# Patient Record
Sex: Female | Born: 1965
Health system: Southern US, Community
[De-identification: ages and names within clinical notes are randomized; demographics above are authoritative.]

## PROBLEM LIST (undated history)

## (undated) DIAGNOSIS — M1990 Unspecified osteoarthritis, unspecified site: Secondary | ICD-10-CM

## (undated) DIAGNOSIS — I10 Essential (primary) hypertension: Secondary | ICD-10-CM

## (undated) DIAGNOSIS — F329 Major depressive disorder, single episode, unspecified: Secondary | ICD-10-CM

## (undated) DIAGNOSIS — E119 Type 2 diabetes mellitus without complications: Secondary | ICD-10-CM

## (undated) DIAGNOSIS — F32A Depression, unspecified: Secondary | ICD-10-CM

## (undated) DIAGNOSIS — F419 Anxiety disorder, unspecified: Secondary | ICD-10-CM

## (undated) HISTORY — PX: CHOLECYSTECTOMY: SHX55

## (undated) HISTORY — PX: BACK SURGERY: SHX140

## (undated) HISTORY — PX: ABDOMINAL HYSTERECTOMY: SHX81

---

## 1998-01-05 ENCOUNTER — Other Ambulatory Visit: Admission: RE | Admit: 1998-01-05 | Discharge: 1998-01-05 | Payer: Self-pay | Admitting: Internal Medicine

## 1998-04-09 ENCOUNTER — Encounter: Admission: RE | Admit: 1998-04-09 | Discharge: 1998-05-01 | Payer: Self-pay | Admitting: Orthopaedic Surgery

## 1999-02-15 ENCOUNTER — Encounter: Payer: Self-pay | Admitting: Internal Medicine

## 1999-02-15 ENCOUNTER — Encounter: Admission: RE | Admit: 1999-02-15 | Discharge: 1999-02-15 | Payer: Self-pay | Admitting: Internal Medicine

## 1999-03-24 ENCOUNTER — Ambulatory Visit (HOSPITAL_COMMUNITY): Admission: RE | Admit: 1999-03-24 | Discharge: 1999-03-24 | Payer: Self-pay | Admitting: Neurosurgery

## 1999-03-24 ENCOUNTER — Encounter: Payer: Self-pay | Admitting: Neurosurgery

## 1999-04-07 ENCOUNTER — Ambulatory Visit (HOSPITAL_COMMUNITY): Admission: RE | Admit: 1999-04-07 | Discharge: 1999-04-07 | Payer: Self-pay | Admitting: Neurosurgery

## 1999-04-07 ENCOUNTER — Encounter: Payer: Self-pay | Admitting: Neurosurgery

## 1999-04-27 ENCOUNTER — Encounter: Payer: Self-pay | Admitting: Neurosurgery

## 1999-04-27 ENCOUNTER — Ambulatory Visit (HOSPITAL_COMMUNITY): Admission: RE | Admit: 1999-04-27 | Discharge: 1999-04-27 | Payer: Self-pay | Admitting: Neurosurgery

## 1999-05-10 ENCOUNTER — Other Ambulatory Visit: Admission: RE | Admit: 1999-05-10 | Discharge: 1999-05-10 | Payer: Self-pay | Admitting: Family Medicine

## 1999-05-26 ENCOUNTER — Encounter: Payer: Self-pay | Admitting: Neurosurgery

## 1999-05-28 ENCOUNTER — Observation Stay (HOSPITAL_COMMUNITY): Admission: RE | Admit: 1999-05-28 | Discharge: 1999-05-29 | Payer: Self-pay | Admitting: Neurosurgery

## 1999-05-28 ENCOUNTER — Encounter: Payer: Self-pay | Admitting: Neurosurgery

## 1999-08-04 ENCOUNTER — Encounter: Payer: Self-pay | Admitting: Emergency Medicine

## 1999-08-04 ENCOUNTER — Emergency Department (HOSPITAL_COMMUNITY): Admission: EM | Admit: 1999-08-04 | Discharge: 1999-08-04 | Payer: Self-pay | Admitting: Emergency Medicine

## 1999-08-15 ENCOUNTER — Emergency Department (HOSPITAL_COMMUNITY): Admission: EM | Admit: 1999-08-15 | Discharge: 1999-08-15 | Payer: Self-pay | Admitting: Emergency Medicine

## 1999-11-06 ENCOUNTER — Ambulatory Visit (HOSPITAL_COMMUNITY): Admission: RE | Admit: 1999-11-06 | Discharge: 1999-11-06 | Payer: Self-pay | Admitting: Neurosurgery

## 1999-11-06 ENCOUNTER — Encounter: Payer: Self-pay | Admitting: Neurosurgery

## 1999-11-24 ENCOUNTER — Ambulatory Visit (HOSPITAL_COMMUNITY): Admission: RE | Admit: 1999-11-24 | Discharge: 1999-11-24 | Payer: Self-pay | Admitting: Neurosurgery

## 1999-11-24 ENCOUNTER — Encounter: Payer: Self-pay | Admitting: Neurosurgery

## 1999-12-08 ENCOUNTER — Ambulatory Visit (HOSPITAL_COMMUNITY): Admission: RE | Admit: 1999-12-08 | Discharge: 1999-12-08 | Payer: Self-pay | Admitting: Neurosurgery

## 1999-12-08 ENCOUNTER — Encounter: Payer: Self-pay | Admitting: Neurosurgery

## 1999-12-31 ENCOUNTER — Encounter: Admission: RE | Admit: 1999-12-31 | Discharge: 1999-12-31 | Payer: Self-pay | Admitting: Neurosurgery

## 1999-12-31 ENCOUNTER — Encounter: Payer: Self-pay | Admitting: Neurosurgery

## 2000-10-19 ENCOUNTER — Other Ambulatory Visit: Admission: RE | Admit: 2000-10-19 | Discharge: 2000-10-19 | Payer: Self-pay | Admitting: Family Medicine

## 2001-01-31 ENCOUNTER — Encounter: Payer: Self-pay | Admitting: Neurosurgery

## 2001-01-31 ENCOUNTER — Ambulatory Visit (HOSPITAL_COMMUNITY): Admission: RE | Admit: 2001-01-31 | Discharge: 2001-01-31 | Payer: Self-pay | Admitting: Neurosurgery

## 2001-05-02 ENCOUNTER — Ambulatory Visit (HOSPITAL_COMMUNITY): Admission: RE | Admit: 2001-05-02 | Discharge: 2001-05-02 | Payer: Self-pay | Admitting: Family Medicine

## 2003-04-23 ENCOUNTER — Other Ambulatory Visit: Payer: Self-pay

## 2004-02-20 ENCOUNTER — Emergency Department: Payer: Self-pay | Admitting: Unknown Physician Specialty

## 2004-06-15 ENCOUNTER — Emergency Department: Payer: Self-pay | Admitting: General Practice

## 2004-08-23 ENCOUNTER — Emergency Department: Payer: Self-pay | Admitting: General Practice

## 2004-10-18 ENCOUNTER — Emergency Department: Payer: Self-pay | Admitting: Emergency Medicine

## 2004-10-27 ENCOUNTER — Emergency Department: Payer: Self-pay | Admitting: Emergency Medicine

## 2005-01-04 ENCOUNTER — Emergency Department: Payer: Self-pay | Admitting: Emergency Medicine

## 2005-01-22 ENCOUNTER — Emergency Department: Payer: Self-pay | Admitting: Emergency Medicine

## 2006-05-02 ENCOUNTER — Emergency Department: Payer: Self-pay | Admitting: Emergency Medicine

## 2006-10-10 ENCOUNTER — Emergency Department: Payer: Self-pay | Admitting: Emergency Medicine

## 2011-01-27 ENCOUNTER — Emergency Department: Payer: Self-pay | Admitting: Emergency Medicine

## 2011-04-01 ENCOUNTER — Emergency Department: Payer: Self-pay | Admitting: Emergency Medicine

## 2011-10-10 ENCOUNTER — Emergency Department: Payer: Self-pay | Admitting: Emergency Medicine

## 2012-10-31 ENCOUNTER — Emergency Department: Payer: Self-pay | Admitting: Emergency Medicine

## 2012-12-29 ENCOUNTER — Emergency Department: Payer: Self-pay | Admitting: Emergency Medicine

## 2013-02-22 ENCOUNTER — Emergency Department: Payer: Self-pay | Admitting: Internal Medicine

## 2013-07-06 ENCOUNTER — Emergency Department: Payer: Self-pay | Admitting: Emergency Medicine

## 2013-07-06 ENCOUNTER — Emergency Department: Payer: Self-pay | Admitting: Internal Medicine

## 2013-07-07 LAB — CBC
HCT: 45.9 % (ref 35.0–47.0)
HGB: 15.2 g/dL (ref 12.0–16.0)
MCH: 29.9 pg (ref 26.0–34.0)
MCHC: 33.1 g/dL (ref 32.0–36.0)
MCV: 90 fL (ref 80–100)
Platelet: 257 10*3/uL (ref 150–440)
RBC: 5.08 10*6/uL (ref 3.80–5.20)
RDW: 13.7 % (ref 11.5–14.5)
WBC: 6.5 10*3/uL (ref 3.6–11.0)

## 2013-07-07 LAB — BASIC METABOLIC PANEL
Anion Gap: 11 (ref 7–16)
BUN: 10 mg/dL (ref 7–18)
Calcium, Total: 9.5 mg/dL (ref 8.5–10.1)
Chloride: 101 mmol/L (ref 98–107)
Co2: 20 mmol/L — ABNORMAL LOW (ref 21–32)
Creatinine: 1.2 mg/dL (ref 0.60–1.30)
EGFR (African American): >60
GFR CALC NON AF AMER: 54 — AB
Glucose: 384 mg/dL — ABNORMAL HIGH (ref 65–99)
Osmolality: 279 (ref 275–301)
Potassium: 4 mmol/L (ref 3.5–5.1)
Sodium: 132 mmol/L — ABNORMAL LOW (ref 136–145)

## 2013-07-07 LAB — URINALYSIS, COMPLETE
Bacteria: NONE SEEN
Bilirubin,UR: NEGATIVE
Blood: NEGATIVE
LEUKOCYTE ESTERASE: NEGATIVE
Nitrite: NEGATIVE
PROTEIN: NEGATIVE
Ph: 6 (ref 4.5–8.0)
RBC,UR: <1 /HPF (ref 0–5)
Specific Gravity: 1.023 (ref 1.003–1.030)
Squamous Epithelial: <1
WBC UR: <1 /HPF (ref 0–5)

## 2013-07-07 LAB — TROPONIN I: Troponin-I: <0.02 ng/mL

## 2013-10-11 ENCOUNTER — Emergency Department: Payer: Self-pay | Admitting: Emergency Medicine

## 2013-10-23 ENCOUNTER — Emergency Department: Payer: Self-pay | Admitting: Emergency Medicine

## 2013-12-19 ENCOUNTER — Ambulatory Visit: Payer: Self-pay | Attending: Internal Medicine | Admitting: Internal Medicine

## 2013-12-19 ENCOUNTER — Ambulatory Visit: Payer: Self-pay | Attending: Internal Medicine

## 2013-12-19 ENCOUNTER — Encounter: Payer: Self-pay | Admitting: Internal Medicine

## 2013-12-19 VITALS — BP 161/88 | HR 71 | Temp 98.2°F | Resp 16 | Wt 183.6 lb

## 2013-12-19 DIAGNOSIS — F3289 Other specified depressive episodes: Secondary | ICD-10-CM | POA: Insufficient documentation

## 2013-12-19 DIAGNOSIS — Z8249 Family history of ischemic heart disease and other diseases of the circulatory system: Secondary | ICD-10-CM | POA: Insufficient documentation

## 2013-12-19 DIAGNOSIS — Z1389 Encounter for screening for other disorder: Secondary | ICD-10-CM | POA: Insufficient documentation

## 2013-12-19 DIAGNOSIS — L293 Anogenital pruritus, unspecified: Secondary | ICD-10-CM

## 2013-12-19 DIAGNOSIS — N898 Other specified noninflammatory disorders of vagina: Secondary | ICD-10-CM

## 2013-12-19 DIAGNOSIS — E089 Diabetes mellitus due to underlying condition without complications: Secondary | ICD-10-CM

## 2013-12-19 DIAGNOSIS — F17200 Nicotine dependence, unspecified, uncomplicated: Secondary | ICD-10-CM | POA: Insufficient documentation

## 2013-12-19 DIAGNOSIS — E119 Type 2 diabetes mellitus without complications: Secondary | ICD-10-CM | POA: Insufficient documentation

## 2013-12-19 DIAGNOSIS — F329 Major depressive disorder, single episode, unspecified: Secondary | ICD-10-CM | POA: Insufficient documentation

## 2013-12-19 DIAGNOSIS — F32A Depression, unspecified: Secondary | ICD-10-CM

## 2013-12-19 DIAGNOSIS — F17210 Nicotine dependence, cigarettes, uncomplicated: Secondary | ICD-10-CM | POA: Insufficient documentation

## 2013-12-19 DIAGNOSIS — N899 Noninflammatory disorder of vagina, unspecified: Secondary | ICD-10-CM | POA: Insufficient documentation

## 2013-12-19 DIAGNOSIS — H538 Other visual disturbances: Secondary | ICD-10-CM | POA: Insufficient documentation

## 2013-12-19 DIAGNOSIS — Z888 Allergy status to other drugs, medicaments and biological substances status: Secondary | ICD-10-CM | POA: Insufficient documentation

## 2013-12-19 DIAGNOSIS — Z833 Family history of diabetes mellitus: Secondary | ICD-10-CM | POA: Insufficient documentation

## 2013-12-19 DIAGNOSIS — I10 Essential (primary) hypertension: Secondary | ICD-10-CM | POA: Insufficient documentation

## 2013-12-19 DIAGNOSIS — E139 Other specified diabetes mellitus without complications: Secondary | ICD-10-CM

## 2013-12-19 DIAGNOSIS — Z139 Encounter for screening, unspecified: Secondary | ICD-10-CM

## 2013-12-19 LAB — CBC WITH DIFFERENTIAL/PLATELET
BASOS ABS: 0 10*3/uL (ref 0.0–0.1)
Basophils Relative: 0 % (ref 0–1)
EOS PCT: 2 % (ref 0–5)
Eosinophils Absolute: 0.2 10*3/uL (ref 0.0–0.7)
HCT: 45 % (ref 36.0–46.0)
HEMOGLOBIN: 15.6 g/dL — AB (ref 12.0–15.0)
LYMPHS ABS: 4.7 10*3/uL — AB (ref 0.7–4.0)
Lymphocytes Relative: 41 % (ref 12–46)
MCH: 30.4 pg (ref 26.0–34.0)
MCHC: 34.7 g/dL (ref 30.0–36.0)
MCV: 87.5 fL (ref 78.0–100.0)
MONO ABS: 0.6 10*3/uL (ref 0.1–1.0)
MONOS PCT: 5 % (ref 3–12)
NEUTROS ABS: 5.9 10*3/uL (ref 1.7–7.7)
Neutrophils Relative %: 52 % (ref 43–77)
Platelets: 318 10*3/uL (ref 150–400)
RBC: 5.14 MIL/uL — AB (ref 3.87–5.11)
RDW: 13.6 % (ref 11.5–15.5)
WBC: 11.4 10*3/uL — AB (ref 4.0–10.5)

## 2013-12-19 LAB — GLUCOSE, POCT (MANUAL RESULT ENTRY): POC Glucose: 310 mg/dl — AB (ref 70–99)

## 2013-12-19 LAB — POCT GLYCOSYLATED HEMOGLOBIN (HGB A1C)

## 2013-12-19 MED ORDER — RAMIPRIL 2.5 MG PO CAPS: 2.5000 mg | ORAL_CAPSULE | Freq: Every day | ORAL | Status: DC

## 2013-12-19 MED ORDER — FLUCONAZOLE 150 MG PO TABS: 150.0000 mg | ORAL_TABLET | Freq: Once | ORAL | Status: DC

## 2013-12-19 MED ORDER — INSULIN ASPART 100 UNIT/ML ~~LOC~~ SOLN
10.0000 [IU] | Freq: Once | SUBCUTANEOUS | Status: AC
  Administered 2013-12-19: 10 [IU] via SUBCUTANEOUS

## 2013-12-19 MED ORDER — INSULIN ASPART PROT & ASPART (70-30 MIX) 100 UNIT/ML ~~LOC~~ SUSP: 15.0000 [IU] | Freq: Two times a day (BID) | SUBCUTANEOUS | Status: DC

## 2013-12-19 MED ORDER — BUPROPION HCL ER (XL) 150 MG PO TB24: 150.0000 mg | ORAL_TABLET | Freq: Every day | ORAL | Status: DC

## 2013-12-19 NOTE — Patient Instructions (Addendum)
Smoking Cessation Quitting smoking is important to your health and has many advantages. However, it is not always easy to quit since nicotine is a very addictive drug. Oftentimes, people try 3 times or more before being able to quit. This document explains the best ways for you to prepare to quit smoking. Quitting takes hard work and a lot of effort, but you can do it. ADVANTAGES OF QUITTING SMOKING  You will live longer, feel better, and live better.  Your body will feel the impact of quitting smoking almost immediately.  Within 20 minutes, blood pressure decreases. Your pulse returns to its normal level.  After 8 hours, carbon monoxide levels in the blood return to normal. Your oxygen level increases.  After 24 hours, the chance of having a heart attack starts to decrease. Your breath, hair, and body stop smelling like smoke.  After 48 hours, damaged nerve endings begin to recover. Your sense of taste and smell improve.  After 72 hours, the body is virtually free of nicotine. Your bronchial tubes relax and breathing becomes easier.  After 2 to 12 weeks, lungs can hold more air. Exercise becomes easier and circulation improves.  The risk of having a heart attack, stroke, cancer, or lung disease is greatly reduced.  After 1 year, the risk of coronary heart disease is cut in half.  After 5 years, the risk of stroke falls to the same as a nonsmoker.  After 10 years, the risk of lung cancer is cut in half and the risk of other cancers decreases significantly.  After 15 years, the risk of coronary heart disease drops, usually to the level of a nonsmoker.  If you are pregnant, quitting smoking will improve your chances of having a healthy baby.  The people you live with, especially any children, will be healthier.  You will have extra money to spend on things other than cigarettes. QUESTIONS TO THINK ABOUT BEFORE ATTEMPTING TO QUIT You may want to talk about your answers with your  health care provider.  Why do you want to quit?  If you tried to quit in the past, what helped and what did not?  What will be the most difficult situations for you after you quit? How will you plan to handle them?  Who can help you through the tough times? Your family? Friends? A health care provider?  What pleasures do you get from smoking? What ways can you still get pleasure if you quit? Here are some questions to ask your health care provider:  How can you help me to be successful at quitting?  What medicine do you think would be best for me and how should I take it?  What should I do if I need more help?  What is smoking withdrawal like? How can I get information on withdrawal? GET READY  Set a quit date.  Change your environment by getting rid of all cigarettes, ashtrays, matches, and lighters in your home, car, or work. Do not let people smoke in your home.  Review your past attempts to quit. Think about what worked and what did not. GET SUPPORT AND ENCOURAGEMENT You have a better chance of being successful if you have help. You can get support in many ways.  Tell your family, friends, and coworkers that you are going to quit and need their support. Ask them not to smoke around you.  Get individual, group, or telephone counseling and support. Programs are available at local hospitals and health centers. Call   your local health department for information about programs in your area.  Spiritual beliefs and practices may help some smokers quit.  Download a "quit meter" on your computer to keep track of quit statistics, such as how long you have gone without smoking, cigarettes not smoked, and money saved.  Get a self-help book about quitting smoking and staying off tobacco. LEARN NEW SKILLS AND BEHAVIORS  Distract yourself from urges to smoke. Talk to someone, go for a walk, or occupy your time with a task.  Change your normal routine. Take a different route to work.  Drink tea instead of coffee. Eat breakfast in a different place.  Reduce your stress. Take a hot bath, exercise, or read a book.  Plan something enjoyable to do every day. Reward yourself for not smoking.  Explore interactive web-based programs that specialize in helping you quit. GET MEDICINE AND USE IT CORRECTLY Medicines can help you stop smoking and decrease the urge to smoke. Combining medicine with the above behavioral methods and support can greatly increase your chances of successfully quitting smoking.  Nicotine replacement therapy helps deliver nicotine to your body without the negative effects and risks of smoking. Nicotine replacement therapy includes nicotine gum, lozenges, inhalers, nasal sprays, and skin patches. Some may be available over-the-counter and others require a prescription.  Antidepressant medicine helps people abstain from smoking, but how this works is unknown. This medicine is available by prescription.  Nicotinic receptor partial agonist medicine simulates the effect of nicotine in your brain. This medicine is available by prescription. Ask your health care provider for advice about which medicines to use and how to use them based on your health history. Your health care provider will tell you what side effects to look out for if you choose to be on a medicine or therapy. Carefully read the information on the package. Do not use any other product containing nicotine while using a nicotine replacement product.  RELAPSE OR DIFFICULT SITUATIONS Most relapses occur within the first 3 months after quitting. Do not be discouraged if you start smoking again. Remember, most people try several times before finally quitting. You may have symptoms of withdrawal because your body is used to nicotine. You may crave cigarettes, be irritable, feel very hungry, cough often, get headaches, or have difficulty concentrating. The withdrawal symptoms are only temporary. They are strongest  when you first quit, but they will go away within 10-14 days. To reduce the chances of relapse, try to:  Avoid drinking alcohol. Drinking lowers your chances of successfully quitting.  Reduce the amount of caffeine you consume. Once you quit smoking, the amount of caffeine in your body increases and can give you symptoms, such as a rapid heartbeat, sweating, and anxiety.  Avoid smokers because they can make you want to smoke.  Do not let weight gain distract you. Many smokers will gain weight when they quit, usually less than 10 pounds. Eat a healthy diet and stay active. You can always lose the weight gained after you quit.  Find ways to improve your mood other than smoking. FOR MORE INFORMATION  www.smokefree.gov  Document Released: 04/05/2001 Document Revised: 08/26/2013 Document Reviewed: 07/21/2011 Exit[employer] Patient Information 2015 [employer]. This information is not intended to replace advice given to you by your health care provider. Make sure you discuss any questions you have with your health care provider. DASH Eating Plan DASH stands for "Dietary Approaches to Stop Hypertension." The DASH eating plan is a healthy eating plan that has   been shown to reduce high blood pressure (hypertension). Additional health benefits may include reducing the risk of type 2 diabetes mellitus, heart disease, and stroke. The DASH eating plan may also help with weight loss. WHAT DO I NEED TO KNOW ABOUT THE DASH EATING PLAN? For the DASH eating plan, you will follow these general guidelines:  Choose foods with a percent daily value for sodium of less than 5% (as listed on the food label).  Use salt-free seasonings or herbs instead of table salt or sea salt.  Check with your health care provider or pharmacist before using salt substitutes.  Eat lower-sodium products, often labeled as "lower sodium" or "no salt added."  Eat fresh foods.  Eat more vegetables, fruits, and low-fat dairy  products.  Choose whole grains. Look for the word "whole" as the first word in the ingredient list.  Choose fish and skinless chicken or turkey more often than red meat. Limit fish, poultry, and meat to 6 oz (170 g) each day.  Limit sweets, desserts, sugars, and sugary drinks.  Choose heart-healthy fats.  Limit cheese to 1 oz (28 g) per day.  Eat more home-cooked food and less restaurant, buffet, and fast food.  Limit fried foods.  Cook foods using methods other than frying.  Limit canned vegetables. If you do use them, rinse them well to decrease the sodium.  When eating at a restaurant, ask that your food be prepared with less salt, or no salt if possible. WHAT FOODS CAN I EAT? Seek help from a dietitian for individual calorie needs. Grains Whole grain or whole wheat bread. Brown rice. Whole grain or whole wheat pasta. Quinoa, bulgur, and whole grain cereals. Low-sodium cereals. Corn or whole wheat flour tortillas. Whole grain cornbread. Whole grain crackers. Low-sodium crackers. Vegetables Fresh or frozen vegetables (raw, steamed, roasted, or grilled). Low-sodium or reduced-sodium tomato and vegetable juices. Low-sodium or reduced-sodium tomato sauce and paste. Low-sodium or reduced-sodium canned vegetables.  Fruits All fresh, canned (in natural juice), or frozen fruits. Meat and Other Protein Products Ground beef (85% or leaner), grass-fed beef, or beef trimmed of fat. Skinless chicken or turkey. Ground chicken or turkey. Pork trimmed of fat. All fish and seafood. Eggs. Dried beans, peas, or lentils. Unsalted nuts and seeds. Unsalted canned beans. Dairy Low-fat dairy products, such as skim or 1% milk, 2% or reduced-fat cheeses, low-fat ricotta or cottage cheese, or plain low-fat yogurt. Low-sodium or reduced-sodium cheeses. Fats and Oils Tub margarines without trans fats. Light or reduced-fat mayonnaise and salad dressings (reduced sodium). Avocado. Safflower, olive, or canola  oils. Natural peanut or almond butter. Other Unsalted popcorn and pretzels. The items listed above may not be a complete list of recommended foods or beverages. Contact your dietitian for more options. WHAT FOODS ARE NOT RECOMMENDED? Grains White bread. White pasta. White rice. Refined cornbread. Bagels and croissants. Crackers that contain trans fat. Vegetables Creamed or fried vegetables. Vegetables in a cheese sauce. Regular canned vegetables. Regular canned tomato sauce and paste. Regular tomato and vegetable juices. Fruits Dried fruits. Canned fruit in light or heavy syrup. Fruit juice. Meat and Other Protein Products Fatty cuts of meat. Ribs, chicken wings, bacon, sausage, bologna, salami, chitterlings, fatback, hot dogs, bratwurst, and packaged luncheon meats. Salted nuts and seeds. Canned beans with salt. Dairy Whole or 2% milk, cream, half-and-half, and cream cheese. Whole-fat or sweetened yogurt. Full-fat cheeses or blue cheese. Nondairy creamers and whipped toppings. Processed cheese, cheese spreads, or cheese curds. Condiments Onion and garlic salt,   seasoned salt, table salt, and sea salt. Canned and packaged gravies. Worcestershire sauce. Tartar sauce. Barbecue sauce. Teriyaki sauce. Soy sauce, including reduced sodium. Steak sauce. Fish sauce. Oyster sauce. Cocktail sauce. Horseradish. Ketchup and mustard. Meat flavorings and tenderizers. Bouillon cubes. Hot sauce. Tabasco sauce. Marinades. Taco seasonings. Relishes. Fats and Oils Butter, stick margarine, lard, shortening, ghee, and bacon fat. Coconut, palm kernel, or palm oils. Regular salad dressings. Other Pickles and olives. Salted popcorn and pretzels. The items listed above may not be a complete list of foods and beverages to avoid. Contact your dietitian for more information. WHERE CAN I FIND MORE INFORMATION? National Heart, Lung, and [employer] Institute: www.nhlbi.nih.gov/health/health-topics/topics/dash/ Document Released:  03/31/2011 Document Revised: 08/26/2013 Document Reviewed: 02/13/2013 ExitCare Patient Information 2015 [employer]. This information is not intended to replace advice given to you by your health care provider. Make sure you discuss any questions you have with your health care provider.  

## 2013-12-19 NOTE — Progress Notes (Signed)
Patient Demographics  Sara Vasquez, is a 48 y.o. female  [id number]  [id number]  DOB - [birth year]  CC:  Chief Complaint  Patient presents with  . Establish Care    dm       HPI: Sara Vasquez is a 47 y.o. female here today to establish medical care. She is history of diabetes for several years, patient is only taking Amaryl 2 mg twice a day, as per patient her blood pressure has been running high for the last one year but she never took any medication for that, her fasting sugar is usually more than 200 mg deciliter, her fasting sugar today is more than 300 mg/dL she denies any polyuria polydipsia but does complain of blurry vision denies any chest pain or shortness of breath, she does smoke cigarettes, I have advised patient to quit smoking she also reports history of depression and used to be on some medication in the past I have discussed with the patient her she can try Wellbutrin which will help her with both depression and to help with the quit smoking, she also complained of vaginal itching but denies any discharge. She is given 10 units of insulin today, her A1c is 11%.  Patient has No headache, No chest pain, No abdominal pain - No Nausea, No new weakness tingling or numbness, No Cough - SOB.  Allergies  Allergen Reactions  . Fentanyl   . Metformin And Related     Mood swings  . Phenergan [Promethazine Hcl]    No past medical history on file. No current outpatient prescriptions on file prior to visit.   No current facility-administered medications on file prior to visit.   Family History  Problem Relation Age of Onset  . Diabetes Mother   . Heart disease Mother   . Hypertension Father   . Heart disease Father   . Cancer Paternal Aunt     lung cancer  . Cancer Maternal Grandmother     breast cancer  . Diabetes Maternal Grandfather    History   Social History  . Marital Status: Married    Spouse Name: N/A    Number of Children: N/A  . Years of  Education: N/A   Occupational History  . Not on file.   Social History Main Topics  . Smoking status: Heavy Tobacco Smoker -- 1.00 packs/day for 35 years  . Smokeless tobacco: Not on file  . Alcohol Use: No  . Drug Use: Not on file  . Sexual Activity: Not on file   Other Topics Concern  . Not on file   Social History Narrative  . No narrative on file    Review of Systems: Constitutional: Negative for fever, chills, diaphoresis, activity change, appetite change and fatigue. HENT: Negative for ear pain, nosebleeds, congestion, facial swelling, rhinorrhea, neck pain, neck stiffness and ear discharge.  Eyes: Negative for pain, discharge, redness, itching and visual disturbance. Respiratory: Negative for cough, choking, chest tightness, shortness of breath, wheezing and stridor.  Cardiovascular: Negative for chest pain, palpitations and leg swelling. Gastrointestinal: Negative for abdominal distention. Genitourinary: Negative for dysuria, urgency, frequency, hematuria, flank pain, decreased urine volume, difficulty urinating and dyspareunia.  Musculoskeletal: Negative for back pain, joint swelling, arthralgia and gait problem. Neurological: Negative for dizziness, tremors, seizures, syncope, facial asymmetry, speech difficulty, weakness, light-headedness, numbness and headaches.  Hematological: Negative for adenopathy. Does not bruise/bleed easily. Psychiatric/Behavioral: Negative for hallucinations, behavioral problems, confusion, dysphoric mood, decreased concentration and agitation.    Objective:  Filed Vitals:   12/19/13 0937  BP: 161/88  Pulse: 71  Temp: 98.2 F (36.8 C)  Resp: 16    Physical Exam: Constitutional: Patient appears well-developed and well-nourished. No distress. HENT: Normocephalic, atraumatic, External right and left ear normal. Oropharynx is clear and moist. Dental cavities. Eyes: Conjunctivae and EOM are normal. PERRLA, no scleral icterus. Neck:  Normal ROM. Neck supple. No JVD. No tracheal deviation. No thyromegaly. CVS: RRR, S1/S2 +, no murmurs, no gallops, no carotid bruit.  Pulmonary: Effort and breath sounds normal, no stridor, rhonchi, wheezes, rales.  Abdominal: Soft. BS +, no distension, tenderness, rebound or guarding.  Musculoskeletal: Normal range of motion. No edema and no tenderness.  Neuro: Alert. Normal reflexes, muscle tone coordination. No cranial nerve deficit. Skin: Skin is warm and dry. No rash noted. Not diaphoretic. No erythema. No pallor. Psychiatric: Normal mood and affect. Behavior, judgment, thought content normal.  No results found for this basename: WBC, HGB, HCT, MCV, PLT   No results found for this basename: CREATININE, BUN, NA, K, CL, CO2    Lab Results  Component Value Date   HGBA1C 11% 12/19/2013   Lipid Panel  No results found for this basename: chol, trig, hdl, cholhdl, vldl, ldlcalc       Assessment and plan:   1. Diabetes mellitus due to underlying condition without complications Results for orders placed in visit on 12/19/13  GLUCOSE, POCT (MANUAL RESULT ENTRY)      Result Value Ref Range   POC Glucose 310 (*) 70 - 99 mg/dl  POCT GLYCOSYLATED HEMOGLOBIN (HGB A1C)      Result Value Ref Range   Hemoglobin A1C 11%     Diabetes is uncontrolled, have advised patient for diabetes meal planning, also started patient on NovoLog 70/3015 units 2 times a day, she'll continue with Amaryl, patient will get diabetes teaching. - insulin aspart (novoLOG) injection 10 Units; Inject 0.1 mLs (10 Units total) into the skin once. - insulin aspart protamine- aspart (NOVOLOG MIX 70/30) (70-30) 100 UNIT/ML injection; Inject 0.15 mLs (15 Units total) into the skin 2 (two) times daily with a meal.  Dispense: 10 mL; Refill: 11 - Ambulatory referral to Ophthalmology  2. Essential hypertension, benign Advised patient for Dash diet and started on ramipril 2.5 mg daily   3. Smoking Advise patient to quit  smoking she'll try - buPROPion (WELLBUTRIN XL) 150 MG 24 hr tablet; Take 1 tablet (150 mg total) by mouth daily.  Dispense: 30 tablet; Refill: 3  4. Depression  - buPROPion (WELLBUTRIN XL) 150 MG 24 hr tablet; Take 1 tablet (150 mg total) by mouth daily.  Dispense: 30 tablet; Refill: 3  5. Blurry vision  - Ambulatory referral to Ophthalmology  6. Screening  ordered baseline blood work - CBC with Differential - COMPLETE METABOLIC PANEL WITH GFR - TSH - Lipid panel - Vit D  25 hydroxy (rtn osteoporosis monitoring) - MM DIGITAL SCREENING BILATERAL; Future  7. Vaginal itching  prescription Diflucan.      Health Maintenance  -Mammogram: ordered    Return in about 3 months (around 03/21/2014) for diabetes, hypertension, BP check and CBG check in 2 weeks/Nurse Visit.   Doris Cheadle, MD

## 2013-12-20 ENCOUNTER — Other Ambulatory Visit: Payer: Self-pay

## 2013-12-20 DIAGNOSIS — E089 Diabetes mellitus due to underlying condition without complications: Secondary | ICD-10-CM

## 2013-12-20 LAB — COMPLETE METABOLIC PANEL WITH GFR
ALBUMIN: 4.4 g/dL (ref 3.5–5.2)
ALT: 83 U/L — ABNORMAL HIGH (ref 0–35)
AST: 82 U/L — AB (ref 0–37)
Alkaline Phosphatase: 94 U/L (ref 39–117)
BUN: 8 mg/dL (ref 6–23)
CALCIUM: 9.9 mg/dL (ref 8.4–10.5)
CHLORIDE: 98 meq/L (ref 96–112)
CO2: 25 meq/L (ref 19–32)
CREATININE: 0.75 mg/dL (ref 0.50–1.10)
GFR, Est Non African American: >89 mL/min
GLUCOSE: 304 mg/dL — AB (ref 70–99)
POTASSIUM: 4.4 meq/L (ref 3.5–5.3)
Sodium: 135 mEq/L (ref 135–145)
Total Bilirubin: 0.5 mg/dL (ref 0.2–1.2)
Total Protein: 7.5 g/dL (ref 6.0–8.3)

## 2013-12-20 LAB — TSH: TSH: 1.363 u[IU]/mL (ref 0.350–4.500)

## 2013-12-20 LAB — LIPID PANEL
Cholesterol: 321 mg/dL — ABNORMAL HIGH (ref 0–200)
HDL: 42 mg/dL (ref >39)
Total CHOL/HDL Ratio: 7.6 Ratio
Triglycerides: 664 mg/dL — ABNORMAL HIGH (ref <150)

## 2013-12-20 LAB — VITAMIN D 25 HYDROXY (VIT D DEFICIENCY, FRACTURES): Vit D, 25-Hydroxy: 16 ng/mL — ABNORMAL LOW (ref 30–89)

## 2013-12-20 MED ORDER — INSULIN ASPART PROT & ASPART (70-30 MIX) 100 UNIT/ML ~~LOC~~ SUSP: 15.0000 [IU] | Freq: Two times a day (BID) | SUBCUTANEOUS | Status: DC

## 2014-01-02 ENCOUNTER — Telehealth: Payer: Self-pay | Admitting: Internal Medicine

## 2014-01-02 ENCOUNTER — Ambulatory Visit: Payer: Self-pay | Attending: Internal Medicine

## 2014-01-02 DIAGNOSIS — E1165 Type 2 diabetes mellitus with hyperglycemia: Principal | ICD-10-CM

## 2014-01-02 DIAGNOSIS — IMO0001 Reserved for inherently not codable concepts without codable children: Secondary | ICD-10-CM

## 2014-01-02 LAB — GLUCOSE, POCT (MANUAL RESULT ENTRY): POC GLUCOSE: 180 mg/dL — AB (ref 70–99)

## 2014-01-02 NOTE — Telephone Encounter (Signed)
Patient has come in today to request a call back from the nurse for a lab result; please f/u with patient

## 2014-01-15 ENCOUNTER — Telehealth: Payer: Self-pay | Admitting: Emergency Medicine

## 2014-01-15 ENCOUNTER — Telehealth: Payer: Self-pay | Admitting: Internal Medicine

## 2014-01-15 MED ORDER — GEMFIBROZIL 600 MG PO TABS: 600.0000 mg | ORAL_TABLET | Freq: Two times a day (BID) | ORAL | Status: DC

## 2014-01-15 MED ORDER — VITAMIN D (ERGOCALCIFEROL) 1.25 MG (50000 UNIT) PO CAPS: 50000.0000 [IU] | ORAL_CAPSULE | ORAL | Status: DC

## 2014-01-15 NOTE — Telephone Encounter (Signed)
Pt calling to request lab results. Please follow up with patient.

## 2014-01-15 NOTE — Telephone Encounter (Signed)
Pt given lab results with medication instructions to start taking prescribed Vitamin D 50,000 units and Lopid 600 mg tab BID. Pt encouraged to start low fat diet/exercise with repeat blood work in 3 mnths Pt also instructed to refrain from alcohol and tylenol due to elevated LFT's

## 2014-01-29 ENCOUNTER — Ambulatory Visit (HOSPITAL_COMMUNITY)
Admission: RE | Admit: 2014-01-29 | Discharge: 2014-01-29 | Disposition: A | Discharge status: Home / Self Care | Payer: Self-pay | Source: Ambulatory Visit | Attending: Internal Medicine | Admitting: Internal Medicine

## 2014-01-29 ENCOUNTER — Other Ambulatory Visit: Payer: Self-pay | Admitting: Internal Medicine

## 2014-01-29 DIAGNOSIS — Z139 Encounter for screening, unspecified: Secondary | ICD-10-CM

## 2014-01-29 DIAGNOSIS — Z1231 Encounter for screening mammogram for malignant neoplasm of breast: Secondary | ICD-10-CM

## 2014-02-05 ENCOUNTER — Telehealth: Payer: Self-pay | Admitting: Internal Medicine

## 2014-02-05 NOTE — Telephone Encounter (Signed)
Pt. Called to request results of mammogram. Please f/u with pt.

## 2014-02-07 NOTE — Telephone Encounter (Signed)
Left message with female to return call 

## 2014-02-14 ENCOUNTER — Telehealth: Payer: Self-pay | Admitting: Internal Medicine

## 2014-02-14 NOTE — Telephone Encounter (Signed)
Patient needs refill for: Glimepiride 2 mg ASAP because she has medication until Monday. Patient request her prescription send to our Pharmacy. Please follow up with Patient.

## 2014-02-17 ENCOUNTER — Telehealth: Payer: Self-pay

## 2014-02-17 ENCOUNTER — Other Ambulatory Visit: Payer: Self-pay

## 2014-02-17 MED ORDER — GLIMEPIRIDE 2 MG PO TABS: 2.0000 mg | ORAL_TABLET | Freq: Every day | ORAL | Status: DC

## 2014-02-17 NOTE — Telephone Encounter (Signed)
Patient called requesting a refill on her amaryl for her diabetes [facility]

## 2014-03-03 ENCOUNTER — Emergency Department: Payer: Self-pay | Admitting: Internal Medicine

## 2014-03-04 ENCOUNTER — Other Ambulatory Visit: Payer: Self-pay | Admitting: Internal Medicine

## 2014-03-04 ENCOUNTER — Telehealth: Payer: Self-pay | Admitting: Internal Medicine

## 2014-03-04 NOTE — Telephone Encounter (Signed)
Patient has come in today to request a medication refill for gemfibrozil (LOPID) 600 MG tablet; please f/u with patient about this request

## 2014-03-07 ENCOUNTER — Other Ambulatory Visit: Payer: Self-pay | Admitting: Emergency Medicine

## 2014-03-07 MED ORDER — GEMFIBROZIL 600 MG PO TABS: 600.0000 mg | ORAL_TABLET | Freq: Two times a day (BID) | ORAL | Status: DC

## 2014-03-17 ENCOUNTER — Other Ambulatory Visit: Payer: Self-pay | Admitting: Internal Medicine

## 2014-03-19 ENCOUNTER — Encounter: Payer: Self-pay | Admitting: Internal Medicine

## 2014-03-19 ENCOUNTER — Ambulatory Visit: Payer: Self-pay | Attending: Internal Medicine | Admitting: Internal Medicine

## 2014-03-19 VITALS — BP 131/85 | HR 69 | Temp 98.3°F | Resp 16

## 2014-03-19 DIAGNOSIS — Z720 Tobacco use: Secondary | ICD-10-CM

## 2014-03-19 DIAGNOSIS — E139 Other specified diabetes mellitus without complications: Secondary | ICD-10-CM

## 2014-03-19 DIAGNOSIS — F17210 Nicotine dependence, cigarettes, uncomplicated: Secondary | ICD-10-CM | POA: Insufficient documentation

## 2014-03-19 DIAGNOSIS — R059 Cough, unspecified: Secondary | ICD-10-CM

## 2014-03-19 DIAGNOSIS — E119 Type 2 diabetes mellitus without complications: Secondary | ICD-10-CM | POA: Insufficient documentation

## 2014-03-19 DIAGNOSIS — F17200 Nicotine dependence, unspecified, uncomplicated: Secondary | ICD-10-CM

## 2014-03-19 DIAGNOSIS — R7989 Other specified abnormal findings of blood chemistry: Secondary | ICD-10-CM | POA: Insufficient documentation

## 2014-03-19 DIAGNOSIS — E559 Vitamin D deficiency, unspecified: Secondary | ICD-10-CM | POA: Insufficient documentation

## 2014-03-19 DIAGNOSIS — R05 Cough: Secondary | ICD-10-CM | POA: Insufficient documentation

## 2014-03-19 DIAGNOSIS — E1169 Type 2 diabetes mellitus with other specified complication: Secondary | ICD-10-CM | POA: Insufficient documentation

## 2014-03-19 DIAGNOSIS — R0981 Nasal congestion: Secondary | ICD-10-CM | POA: Insufficient documentation

## 2014-03-19 DIAGNOSIS — I10 Essential (primary) hypertension: Secondary | ICD-10-CM | POA: Insufficient documentation

## 2014-03-19 DIAGNOSIS — E781 Pure hyperglyceridemia: Secondary | ICD-10-CM | POA: Insufficient documentation

## 2014-03-19 DIAGNOSIS — R945 Abnormal results of liver function studies: Secondary | ICD-10-CM

## 2014-03-19 DIAGNOSIS — E785 Hyperlipidemia, unspecified: Secondary | ICD-10-CM

## 2014-03-19 LAB — HEPATITIS C ANTIBODY: HCV Ab: NEGATIVE

## 2014-03-19 LAB — POCT GLYCOSYLATED HEMOGLOBIN (HGB A1C): Hemoglobin A1C: 7.6

## 2014-03-19 LAB — GLUCOSE, POCT (MANUAL RESULT ENTRY): POC Glucose: 176 mg/dl — AB (ref 70–99)

## 2014-03-19 LAB — HEPATITIS B SURFACE ANTIBODY,QUALITATIVE: Hep B S Ab: NEGATIVE

## 2014-03-19 LAB — HEPATITIS B SURFACE ANTIGEN: HEP B S AG: NEGATIVE

## 2014-03-19 MED ORDER — BENZONATATE 100 MG PO CAPS: 100.0000 mg | ORAL_CAPSULE | Freq: Three times a day (TID) | ORAL | Status: DC | PRN

## 2014-03-19 MED ORDER — FLUTICASONE PROPIONATE 50 MCG/ACT NA SUSP: 2.0000 | Freq: Every day | NASAL | Status: DC

## 2014-03-19 MED ORDER — GEMFIBROZIL 600 MG PO TABS: 600.0000 mg | ORAL_TABLET | Freq: Two times a day (BID) | ORAL | Status: DC

## 2014-03-19 MED ORDER — GLIMEPIRIDE 4 MG PO TABS: 4.0000 mg | ORAL_TABLET | Freq: Every day | ORAL | Status: DC

## 2014-03-19 MED ORDER — GLUCOSE BLOOD VI STRP: ORAL_STRIP | Status: DC

## 2014-03-19 NOTE — Progress Notes (Signed)
MRN: [id number] Name: Sara Vasquez  Sex: female Age: 48 y.o. DOB: 10/18/1965  Allergies: Fentanyl; Metformin and related; and Phenergan  Chief Complaint  Patient presents with  . Follow-up    HPI: Patient is 48 y.o. female who has history of diabetes, hypertension, hypertriglyceridemia, as per patient she went to the emergency room at [facility] 2 weeks ago with symptoms of bronchitis as per patient she was prescribed antibiotic which she already finished her and was also given inhaler, she was also advised to use over-the-counter Afrin for nasal congestion, she still have cough and postnasal drip denies any chest pain or shortness of breath denies any fever chills, she still smokes cigarettes, I have advised patient to quit smoking, she had a blood work done which was reviewed with the patient noticed elevated triglycerides, vitamin D deficiency as well as abnormal LFTs, as per patient she does not drink alcohol, denies any previous history of IV drug abuse, for diabetes she has been taking insulin 15 units 2 times a day as well as Amaryl 2 mg, she denies any hypoglycemic symptoms, her hemoglobin A1c has improved.  History reviewed. No pertinent past medical history.  Past Surgical History  Procedure Laterality Date  . Abdominal hysterectomy    . Back surgery    . Cholecystectomy        Medication List       This list is accurate as of: 03/19/14 11:15 AM.  Always use your most recent med list.               benzonatate 100 MG capsule  Commonly known as:  TESSALON  Take 1 capsule (100 mg total) by mouth 3 (three) times daily as needed for cough.     buPROPion 150 MG 24 hr tablet  Commonly known as:  WELLBUTRIN XL  Take 1 tablet (150 mg total) by mouth daily.     fluconazole 150 MG tablet  Commonly known as:  DIFLUCAN  Take 1 tablet (150 mg total) by mouth once.     fluticasone 50 MCG/ACT nasal spray  Commonly known as:  FLONASE  Place 2 sprays into  both nostrils daily.     gemfibrozil 600 MG tablet  Commonly known as:  LOPID  Take 1 tablet (600 mg total) by mouth 2 (two) times daily before a meal.     glimepiride 4 MG tablet  Commonly known as:  AMARYL  Take 1 tablet (4 mg total) by mouth daily with breakfast.     glucose blood test strip  Commonly known as:  CHOICE DM FORA G20 TEST STRIPS  Use as instructed     insulin aspart protamine- aspart (70-30) 100 UNIT/ML injection  Commonly known as:  NOVOLOG MIX 70/30  Inject 0.15 mLs (15 Units total) into the skin 2 (two) times daily with a meal.     ramipril 2.5 MG capsule  Commonly known as:  ALTACE  Take 1 capsule (2.5 mg total) by mouth daily.     Vitamin D (Ergocalciferol) 50000 UNITS Caps capsule  Commonly known as:  DRISDOL  Take 1 capsule (50,000 Units total) by mouth every 7 (seven) days.        Meds ordered this encounter  Medications  . gemfibrozil (LOPID) 600 MG tablet    Sig: Take 1 tablet (600 mg total) by mouth 2 (two) times daily before a meal.    Dispense:  90 tablet    Refill:  3  . glimepiride (  AMARYL) 4 MG tablet    Sig: Take 1 tablet (4 mg total) by mouth daily with breakfast.    Dispense:  30 tablet    Refill:  3  . fluticasone (FLONASE) 50 MCG/ACT nasal spray    Sig: Place 2 sprays into both nostrils daily.    Dispense:  16 g    Refill:  6  . benzonatate (TESSALON) 100 MG capsule    Sig: Take 1 capsule (100 mg total) by mouth 3 (three) times daily as needed for cough.    Dispense:  30 capsule    Refill:  1  . glucose blood (CHOICE DM FORA G20 TEST STRIPS) test strip    Sig: Use as instructed    Dispense:  100 each    Refill:  12    True check test strips     There is no immunization history on file for this patient.  Family History  Problem Relation Age of Onset  . Diabetes Mother   . Heart disease Mother   . Hypertension Father   . Heart disease Father   . Cancer Paternal Aunt     lung cancer  . Cancer Maternal Grandmother      breast cancer  . Diabetes Maternal Grandfather     History  Substance Use Topics  . Smoking status: Heavy Tobacco Smoker -- 1.00 packs/day for 35 years  . Smokeless tobacco: Not on file  . Alcohol Use: No    Review of Systems   As noted in HPI  Filed Vitals:   03/19/14 0940  BP: 131/85  Pulse: 69  Temp: 98.3 F (36.8 C)  Resp: 16    Physical Exam  Physical Exam  HENT:  Nasal congestion no sinus tenderness   Eyes: EOM are normal. Pupils are equal, round, and reactive to light.  Cardiovascular: Normal rate and regular rhythm.   Pulmonary/Chest: Breath sounds normal. No respiratory distress. She has no wheezes. She has no rales.  Abdominal: Soft. There is no tenderness. There is no rebound.    CBC    Component Value Date/Time   WBC 11.4* 12/19/2013 1005   RBC 5.14* 12/19/2013 1005   HGB 15.6* 12/19/2013 1005   HCT 45.0 12/19/2013 1005   PLT 318 12/19/2013 1005   MCV 87.5 12/19/2013 1005   LYMPHSABS 4.7* 12/19/2013 1005   MONOABS 0.6 12/19/2013 1005   EOSABS 0.2 12/19/2013 1005   BASOSABS 0.0 12/19/2013 1005    CMP     Component Value Date/Time   NA 135 12/19/2013 1005   K 4.4 12/19/2013 1005   CL 98 12/19/2013 1005   CO2 25 12/19/2013 1005   GLUCOSE 304* 12/19/2013 1005   BUN 8 12/19/2013 1005   CREATININE 0.75 12/19/2013 1005   CALCIUM 9.9 12/19/2013 1005   PROT 7.5 12/19/2013 1005   ALBUMIN 4.4 12/19/2013 1005   AST 82* 12/19/2013 1005   ALT 83* 12/19/2013 1005   ALKPHOS 94 12/19/2013 1005   BILITOT 0.5 12/19/2013 1005   GFRNONAA >89 12/19/2013 1005   GFRAA >89 12/19/2013 1005    Lab Results  Component Value Date/Time   CHOL 321* 12/19/2013 10:05 AM    No components found for: HGA1C  Lab Results  Component Value Date/Time   AST 82* 12/19/2013 10:05 AM    Assessment and Plan  Other specified diabetes mellitus without complications - Plan:  Results for orders placed or performed in visit on 03/19/14  Glucose (CBG)  Result Value Ref  Range  POC Glucose 176 (A) 70 - 99 mg/dl  HgB [id number]  Result Value Ref Range   Hemoglobin A1C 7.6    On the last visit she was started on insulin, her hemoglobin A1c has improved from 11% to 7.6%, I have advised patient for diabetes declining, she will increase the dose of insulin to 17 units twice a day, also increased the dose of Amaryl, advise patient to keep the fingerstick log. glimepiride (AMARYL) 4 MG tablet  Essential hypertension, benign Blood pressure is well-controlled continued ramipril.  Smoking Advised patient to quit smoking.  Vitamin D deficiency Patient is taking 50,000 units once a week dosage.  Abnormal LFTs - Plan: Ordered hepatitis panel Hepatitis B surface antibody, Hepatitis C antibody, Hepatitis B surface antigen  Hypertriglyceridemia - Plan: continue with gemfibrozil (LOPID) 600 MG tablet, will repeat fasting lipid panel on the next visit.  Nasal congestion - Plan: fluticasone (FLONASE) 50 MCG/ACT nasal spray, also advise for saltwater gargles.  Cough - Plan: benzonatate (TESSALON) 100 MG capsule when necessary for cough.  Return in about 3 months (around 06/19/2014) for diabetes, hyperipidemia.  Doris CheadleADVANI, Taeshaun Rames, MD

## 2014-03-19 NOTE — Progress Notes (Signed)
Patient is here for follow up on her diabetes Recently seen in the ed for bronchitis Complains of not feeling any better still congested and coughing

## 2014-03-26 ENCOUNTER — Telehealth: Payer: Self-pay | Admitting: *Deleted

## 2014-03-26 NOTE — Telephone Encounter (Signed)
-----   Message from Doris Cheadleeepak Advani, MD sent at 03/24/2014  1:40 PM EST ----- Call and let the patient know that her hepatitis panel is negative

## 2014-03-26 NOTE — Telephone Encounter (Signed)
Left message with husband Rozanna BoxDouglas Gapinski, negative labs

## 2014-04-08 ENCOUNTER — Ambulatory Visit: Payer: Self-pay | Attending: Internal Medicine | Admitting: *Deleted

## 2014-04-08 DIAGNOSIS — Z23 Encounter for immunization: Secondary | ICD-10-CM | POA: Insufficient documentation

## 2014-04-11 ENCOUNTER — Emergency Department: Payer: Self-pay | Admitting: Emergency Medicine

## 2014-04-11 ENCOUNTER — Other Ambulatory Visit: Payer: Self-pay | Admitting: Internal Medicine

## 2014-04-16 ENCOUNTER — Other Ambulatory Visit: Payer: Self-pay

## 2014-04-16 DIAGNOSIS — F32A Depression, unspecified: Secondary | ICD-10-CM

## 2014-04-16 DIAGNOSIS — F329 Major depressive disorder, single episode, unspecified: Secondary | ICD-10-CM

## 2014-04-16 DIAGNOSIS — F17200 Nicotine dependence, unspecified, uncomplicated: Secondary | ICD-10-CM

## 2014-04-16 MED ORDER — BUPROPION HCL ER (XL) 150 MG PO TB24: 150.0000 mg | ORAL_TABLET | Freq: Every day | ORAL | Status: DC

## 2014-07-03 ENCOUNTER — Ambulatory Visit: Payer: Self-pay | Attending: Internal Medicine | Admitting: Internal Medicine

## 2014-07-03 ENCOUNTER — Encounter: Payer: Self-pay | Admitting: Internal Medicine

## 2014-07-03 VITALS — BP 129/82 | HR 72 | Temp 98.1°F | Resp 15 | Wt 190.0 lb

## 2014-07-03 DIAGNOSIS — E781 Pure hyperglyceridemia: Secondary | ICD-10-CM

## 2014-07-03 DIAGNOSIS — Z8261 Family history of arthritis: Secondary | ICD-10-CM

## 2014-07-03 DIAGNOSIS — E559 Vitamin D deficiency, unspecified: Secondary | ICD-10-CM

## 2014-07-03 DIAGNOSIS — M2550 Pain in unspecified joint: Secondary | ICD-10-CM

## 2014-07-03 DIAGNOSIS — E119 Type 2 diabetes mellitus without complications: Secondary | ICD-10-CM | POA: Insufficient documentation

## 2014-07-03 DIAGNOSIS — M25562 Pain in left knee: Secondary | ICD-10-CM | POA: Insufficient documentation

## 2014-07-03 DIAGNOSIS — E139 Other specified diabetes mellitus without complications: Secondary | ICD-10-CM

## 2014-07-03 DIAGNOSIS — M2560 Stiffness of unspecified joint, not elsewhere classified: Secondary | ICD-10-CM

## 2014-07-03 DIAGNOSIS — M25561 Pain in right knee: Secondary | ICD-10-CM

## 2014-07-03 DIAGNOSIS — Z720 Tobacco use: Secondary | ICD-10-CM | POA: Insufficient documentation

## 2014-07-03 DIAGNOSIS — I10 Essential (primary) hypertension: Secondary | ICD-10-CM

## 2014-07-03 LAB — LIPID PANEL
CHOLESTEROL: 297 mg/dL — AB (ref 0–200)
HDL: 46 mg/dL (ref >=46)
LDL Cholesterol: 175 mg/dL — ABNORMAL HIGH (ref 0–99)
TRIGLYCERIDES: 378 mg/dL — AB (ref <150)
Total CHOL/HDL Ratio: 6.5 Ratio
VLDL: 76 mg/dL — AB (ref 0–40)

## 2014-07-03 LAB — POCT GLYCOSYLATED HEMOGLOBIN (HGB A1C): Hemoglobin A1C: 8.1

## 2014-07-03 LAB — URIC ACID: Uric Acid, Serum: 6.6 mg/dL (ref 2.4–7.0)

## 2014-07-03 LAB — COMPLETE METABOLIC PANEL WITH GFR
ALBUMIN: 4.9 g/dL (ref 3.5–5.2)
ALT: 55 U/L — ABNORMAL HIGH (ref 0–35)
AST: 40 U/L — AB (ref 0–37)
Alkaline Phosphatase: 58 U/L (ref 39–117)
BILIRUBIN TOTAL: 0.5 mg/dL (ref 0.2–1.2)
BUN: 13 mg/dL (ref 6–23)
CO2: 24 mEq/L (ref 19–32)
CREATININE: 0.86 mg/dL (ref 0.50–1.10)
Calcium: 10.1 mg/dL (ref 8.4–10.5)
Chloride: 98 mEq/L (ref 96–112)
GFR, Est Non African American: 80 mL/min
GLUCOSE: 191 mg/dL — AB (ref 70–99)
Potassium: 4.6 mEq/L (ref 3.5–5.3)
Sodium: 136 mEq/L (ref 135–145)
Total Protein: 8 g/dL (ref 6.0–8.3)

## 2014-07-03 LAB — GLUCOSE, POCT (MANUAL RESULT ENTRY): POC GLUCOSE: 196 mg/dL — AB (ref 70–99)

## 2014-07-03 LAB — RHEUMATOID FACTOR: Rhuematoid fact SerPl-aCnc: <10 IU/mL (ref <=14)

## 2014-07-03 NOTE — Progress Notes (Signed)
MRN: [id number] Name: Sara Vasquez  Sex: female Age: 49 y.o. DOB: 12/29/1965  Allergies: Fentanyl; Metformin and related; and Phenergan  Chief Complaint  Patient presents with  . Follow-up    HPI: Patient is 49 y.o. female who history of diabetes hypertension hyperlipidemia, vitamin D deficiency comes today for followup as per patient she is taking insulin 20 units twice a day as well as Amaryl, denies any hypoglycemic symptoms, usually her fasting blood sugars is more than 1 50 mg/dL, her hemoglobin [id number] noted to have trended up, she is also complaining of multiple joint pain and morning stiffness in and her joints right greater than left, she does report family history of rheumatoid arthritis, she is also complaining of left knee pain she denies any recent fall or trauma.  History reviewed. No pertinent past medical history.  Past Surgical History  Procedure Laterality Date  . Abdominal hysterectomy    . Back surgery    . Cholecystectomy        Medication List       This list is accurate as of: 07/03/14 11:13 AM.  Always use your most recent med list.               benzonatate 100 MG capsule  Commonly known as:  TESSALON  Take 1 capsule (100 mg total) by mouth 3 (three) times daily as needed for cough.     buPROPion 150 MG 24 hr tablet  Commonly known as:  WELLBUTRIN XL  Take 1 tablet (150 mg total) by mouth daily.     fluconazole 150 MG tablet  Commonly known as:  DIFLUCAN  Take 1 tablet (150 mg total) by mouth once.     fluticasone 50 MCG/ACT nasal spray  Commonly known as:  FLONASE  Place 2 sprays into both nostrils daily.     gemfibrozil 600 MG tablet  Commonly known as:  LOPID  Take 1 tablet (600 mg total) by mouth 2 (two) times daily before a meal.     glimepiride 4 MG tablet  Commonly known as:  AMARYL  Take 1 tablet (4 mg total) by mouth daily with breakfast.     glucose blood test strip  Commonly known as:  CHOICE DM FORA G20 TEST STRIPS  Use  as instructed     insulin aspart protamine- aspart (70-30) 100 UNIT/ML injection  Commonly known as:  NOVOLOG MIX 70/30  Inject 0.15 mLs (15 Units total) into the skin 2 (two) times daily with a meal.     ramipril 2.5 MG capsule  Commonly known as:  ALTACE  Take 1 capsule (2.5 mg total) by mouth daily.     Vitamin D (Ergocalciferol) 50000 UNITS Caps capsule  Commonly known as:  DRISDOL  Take 1 capsule (50,000 Units total) by mouth every 7 (seven) days.        No orders of the defined types were placed in this encounter.    Immunization History  Administered Date(s) Administered  . Influenza,inj,Quad PF,36+ Mos 04/08/2014  . Pneumococcal Polysaccharide-23 04/08/2014    Family History  Problem Relation Age of Onset  . Diabetes Mother   . Heart disease Mother   . Hypertension Father   . Heart disease Father   . Cancer Paternal Aunt     lung cancer  . Cancer Maternal Grandmother     breast cancer  . Diabetes Maternal Grandfather     History  Substance Use Topics  . Smoking status: Heavy Tobacco Smoker --  1.00 packs/day for 35 years  . Smokeless tobacco: Not on file  . Alcohol Use: No    Review of Systems   As noted in HPI  Filed Vitals:   07/03/14 1025  BP: 129/82  Pulse: 72  Temp: 98.1 F (36.7 C)  Resp: 15    Physical Exam  Physical Exam  Constitutional: She is oriented to person, place, and time. No distress.  Eyes: EOM are normal. Pupils are equal, round, and reactive to light.  Neck: Neck supple.  Cardiovascular: Normal rate and regular rhythm.   Pulmonary/Chest: Breath sounds normal. No respiratory distress. She has no wheezes. She has no rales.  Musculoskeletal:  Left knee no erythema, swelling or tenderness, crepitation positive  Neurological: She is alert and oriented to person, place, and time.    CBC    Component Value Date/Time   WBC 11.4* 12/19/2013 1005   RBC 5.14* 12/19/2013 1005   HGB 15.6* 12/19/2013 1005   HCT 45.0  12/19/2013 1005   PLT 318 12/19/2013 1005   MCV 87.5 12/19/2013 1005   LYMPHSABS 4.7* 12/19/2013 1005   MONOABS 0.6 12/19/2013 1005   EOSABS 0.2 12/19/2013 1005   BASOSABS 0.0 12/19/2013 1005    CMP     Component Value Date/Time   NA 135 12/19/2013 1005   K 4.4 12/19/2013 1005   CL 98 12/19/2013 1005   CO2 25 12/19/2013 1005   GLUCOSE 304* 12/19/2013 1005   BUN 8 12/19/2013 1005   CREATININE 0.75 12/19/2013 1005   CALCIUM 9.9 12/19/2013 1005   PROT 7.5 12/19/2013 1005   ALBUMIN 4.4 12/19/2013 1005   AST 82* 12/19/2013 1005   ALT 83* 12/19/2013 1005   ALKPHOS 94 12/19/2013 1005   BILITOT 0.5 12/19/2013 1005   GFRNONAA >89 12/19/2013 1005   GFRAA >89 12/19/2013 1005    Lab Results  Component Value Date/Time   CHOL 321* 12/19/2013 10:05 AM    No components found for: HGA1C  Lab Results  Component Value Date/Time   AST 82* 12/19/2013 10:05 AM    Assessment and Plan  Other specified diabetes mellitus without complications - Plan:  Results for orders placed or performed in visit on 07/03/14  Glucose (CBG)  Result Value Ref Range   POC Glucose 196 (A) 70 - 99 mg/dl  HgB N8G  Result Value Ref Range   Hemoglobin A1C 8.10    Hemoglobin A1c has started up, I have advised patient for diabetes planning, increase the dose of insulin to 22 units twice a day as well as continue with Amaryl, continue with fingerstick glucose monitoring, will recheck her A1c in 3 months.  Essential hypertension, benign - Plan:blood pressure is well controlled, continue with current meds, repeat blood chemistry COMPLETE METABOLIC PANEL WITH GFR  Hypertriglyceridemia - Plan: patient is currently on Lopid , repeat Lipid panel  Joint pain - Plan: Rheumatoid factor, ANA, Uric acid  Morning stiffness of joints - Plan: Rheumatoid factor, ANA, Uric acid  Arthralgia of both knees - Plan: DG Knee Bilateral Standing AP  Vitamin D deficiency - Plan: Vit D  25 hydroxy (rtn osteoporosis  monitoring)  Family history of rheumatoid arthritis - Plan: Rheumatoid factor   Health Maintenance  -Vaccinations: Up-to-date with flu shot Pneumovax   Return in about 3 months (around 10/03/2014), or if symptoms worsen or fail to improve, for diabetes, hypertension.   This note has been created with Education officer, environmental. Any transcriptional errors are unintentional.  Lorayne Marek, MD

## 2014-07-03 NOTE — Progress Notes (Signed)
Patient here for follow up on her diabetes Complains of pain to her left knee Patient also complains of swelling to her right hand And having trouble sleeping Patient does not need any refills at this time

## 2014-07-03 NOTE — Patient Instructions (Signed)
Diabetes Mellitus and Food It is important for you to manage your blood sugar (glucose) level. Your blood glucose level can be greatly affected by what you eat. Eating healthier foods in the appropriate amounts throughout the day at about the same time each day will help you control your blood glucose level. It can also help slow or prevent worsening of your diabetes mellitus. Healthy eating may even help you improve the level of your blood pressure and reach or maintain a healthy weight.  HOW CAN FOOD AFFECT ME? Carbohydrates Carbohydrates affect your blood glucose level more than any other type of food. Your dietitian will help you determine how many carbohydrates to eat at each meal and teach you how to count carbohydrates. Counting carbohydrates is important to keep your blood glucose at a healthy level, especially if you are using insulin or taking certain medicines for diabetes mellitus. Alcohol Alcohol can cause sudden decreases in blood glucose (hypoglycemia), especially if you use insulin or take certain medicines for diabetes mellitus. Hypoglycemia can be a life-threatening condition. Symptoms of hypoglycemia (sleepiness, dizziness, and disorientation) are similar to symptoms of having too much alcohol.  If your health care provider has given you approval to drink alcohol, do so in moderation and use the following guidelines:  Women should not have more than one drink per day, and men should not have more than two drinks per day. One drink is equal to:  12 oz of beer.  5 oz of wine.  1 oz of hard liquor.  Do not drink on an empty stomach.  Keep yourself hydrated. Have water, diet soda, or unsweetened iced tea.  Regular soda, juice, and other mixers might contain a lot of carbohydrates and should be counted. WHAT FOODS ARE NOT RECOMMENDED? As you make food choices, it is important to remember that all foods are not the same. Some foods have fewer nutrients per serving than other  foods, even though they might have the same number of calories or carbohydrates. It is difficult to get your body what it needs when you eat foods with fewer nutrients. Examples of foods that you should avoid that are high in calories and carbohydrates but low in nutrients include:  Trans fats (most processed foods list trans fats on the Nutrition Facts label).  Regular soda.  Juice.  Candy.  Sweets, such as cake, pie, doughnuts, and cookies.  Fried foods. WHAT FOODS CAN I EAT? Have nutrient-rich foods, which will nourish your body and keep you healthy. The food you should eat also will depend on several factors, including:  The calories you need.  The medicines you take.  Your weight.  Your blood glucose level.  Your blood pressure level.  Your cholesterol level. You also should eat a variety of foods, including:  Protein, such as meat, poultry, fish, tofu, nuts, and seeds (lean animal proteins are best).  Fruits.  Vegetables.  Dairy products, such as milk, cheese, and yogurt (low fat is best).  Breads, grains, pasta, cereal, rice, and beans.  Fats such as olive oil, trans fat-free margarine, canola oil, avocado, and olives. DOES EVERYONE WITH DIABETES MELLITUS HAVE THE SAME MEAL PLAN? Because every person with diabetes mellitus is different, there is not one meal plan that works for everyone. It is very important that you meet with a dietitian who will help you create a meal plan that is just right for you. Document Released: 01/06/2005 Document Revised: 04/16/2013 Document Reviewed: 03/08/2013 [employer] Patient Information 2015 [employer]. This   information is not intended to replace advice given to you by your health care provider. Make sure you discuss any questions you have with your health care provider.  

## 2014-07-04 ENCOUNTER — Telehealth: Payer: Self-pay

## 2014-07-04 ENCOUNTER — Ambulatory Visit (HOSPITAL_COMMUNITY)
Admission: RE | Admit: 2014-07-04 | Discharge: 2014-07-04 | Disposition: A | Discharge status: Home / Self Care | Payer: Self-pay | Source: Ambulatory Visit | Attending: Internal Medicine | Admitting: Internal Medicine

## 2014-07-04 DIAGNOSIS — M25561 Pain in right knee: Secondary | ICD-10-CM | POA: Insufficient documentation

## 2014-07-04 DIAGNOSIS — M25562 Pain in left knee: Secondary | ICD-10-CM | POA: Insufficient documentation

## 2014-07-04 LAB — ANA: ANA: NEGATIVE

## 2014-07-04 LAB — VITAMIN D 25 HYDROXY (VIT D DEFICIENCY, FRACTURES): VIT D 25 HYDROXY: 12 ng/mL — AB (ref 30–100)

## 2014-07-04 MED ORDER — OMEGA-3-ACID ETHYL ESTERS 1 G PO CAPS: 1.0000 g | ORAL_CAPSULE | Freq: Two times a day (BID) | ORAL | Status: DC

## 2014-07-04 MED ORDER — VITAMIN D (ERGOCALCIFEROL) 1.25 MG (50000 UNIT) PO CAPS: 50000.0000 [IU] | ORAL_CAPSULE | ORAL | Status: DC

## 2014-07-04 NOTE — Telephone Encounter (Signed)
Patient is aware of her lab results and to pick up her prescriptions

## 2014-07-04 NOTE — Telephone Encounter (Signed)
-----   Message from Doris Cheadleeepak Advani, MD sent at 07/04/2014  1:06 PM EST ----- Blood work reviewed, noticed low vitamin D, call patient advise to start ergocalciferol 50,000 units once a week for the duration of  12 weeks. Her test for lupus and rheumatoid arthritis is negative. Her cholesterol shows improvement but still has elevated triglycerides, patient is taking Lopid, advise patient to also start taking lovaza 1G capsule 2 times a day , also advise for low fat diet, will repeat fasting lipid panel on the next visit.

## 2014-07-06 IMAGING — CR DG CHEST 2V
1 series · 2 of 2 positions shown · non-contrast
Comparison: October 31, 2012

CLINICAL DATA: Wheezing and cough

EXAM:
CHEST  2 VIEW

[Series 1: w chest pa · 0.14mm/px · 2 of 2 slices shown]
[im 1/2]
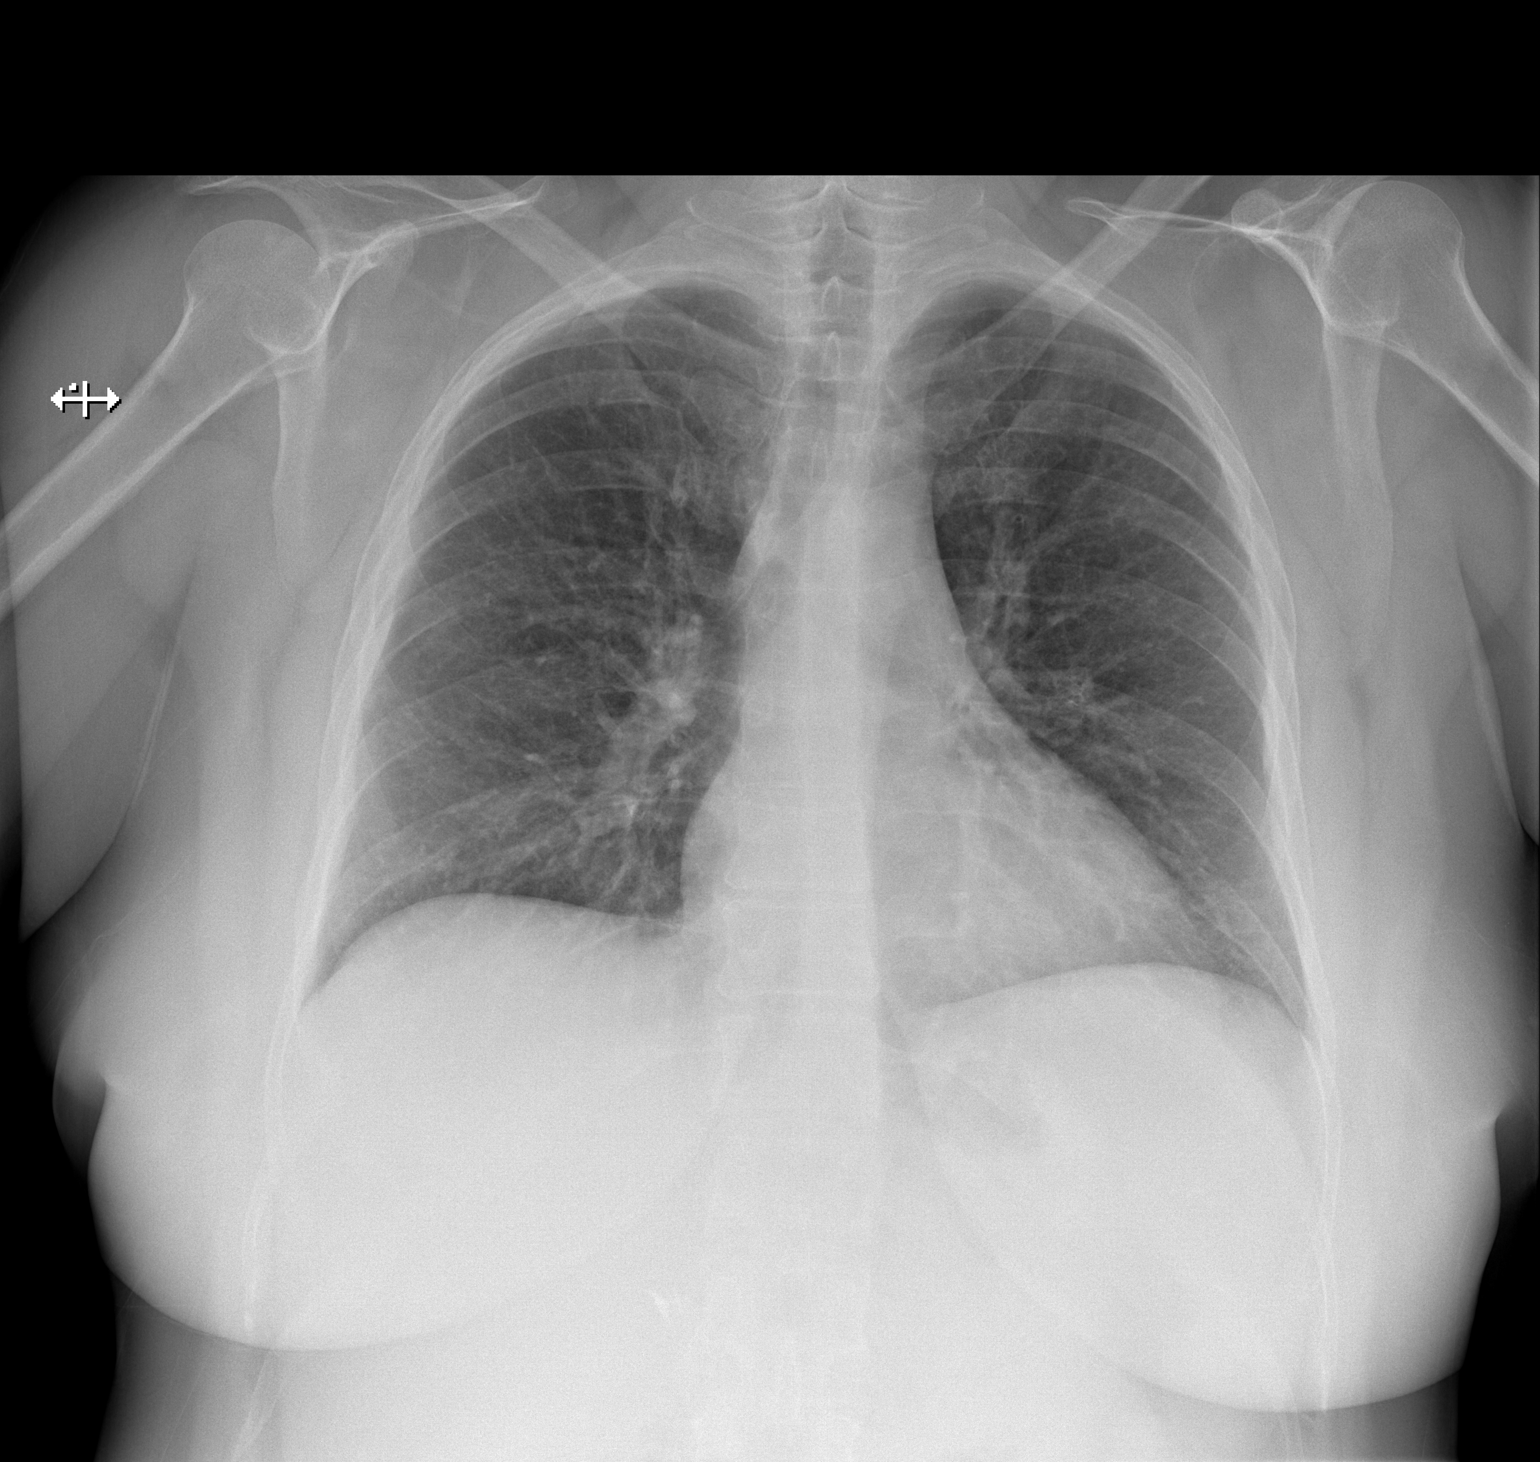
[im 2/2]
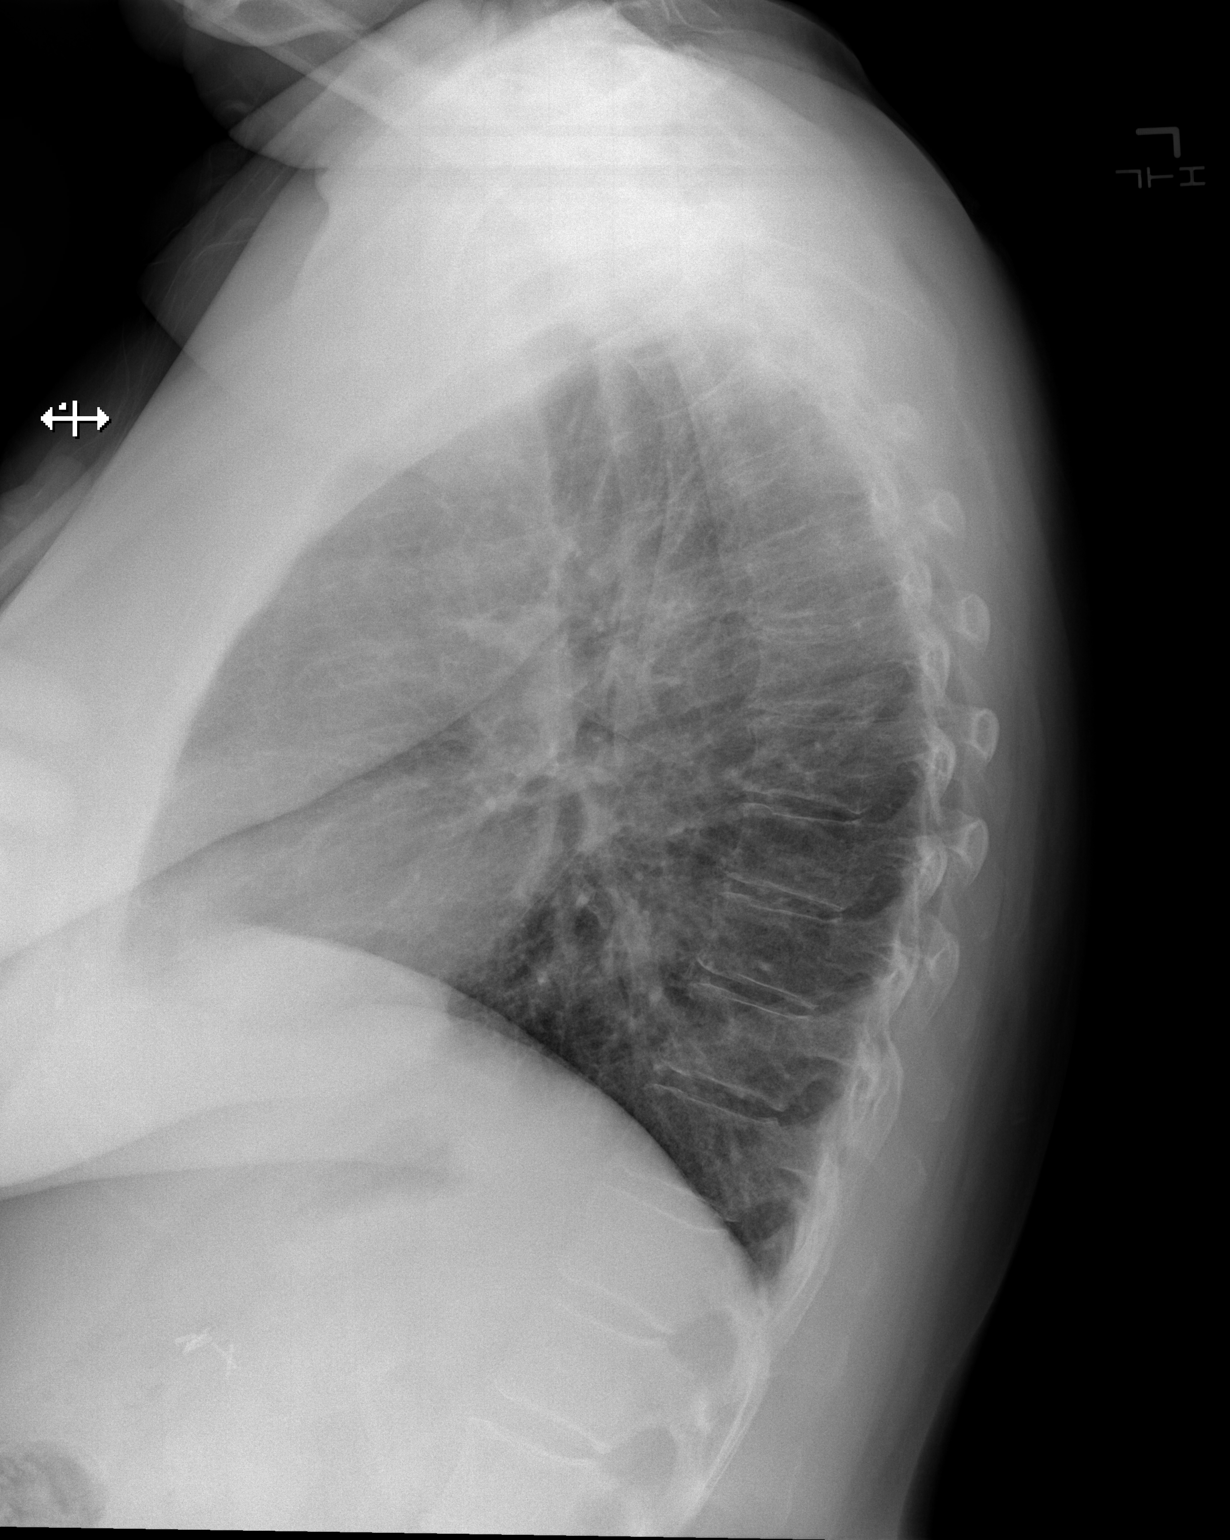

[2 of 2 positions shown; findings below may reference images not displayed]

FINDINGS: Lungs are clear. Heart size and pulmonary vascularity are normal. No
adenopathy. No bone lesions.
IMPRESSION: No edema or consolidation.

## 2014-07-07 ENCOUNTER — Telehealth: Payer: Self-pay

## 2014-07-07 DIAGNOSIS — M25569 Pain in unspecified knee: Secondary | ICD-10-CM

## 2014-07-07 NOTE — Telephone Encounter (Signed)
-----   Message from Doris Cheadleeepak Advani, MD sent at 07/07/2014 10:15 AM EDT ----- Call and let the patient know that her x-ray of knees is reported to be normal.

## 2014-07-07 NOTE — Telephone Encounter (Signed)
Patient is aware of her x ray results Referral to ortho placed in epic

## 2014-07-30 ENCOUNTER — Ambulatory Visit: Payer: Self-pay | Attending: Internal Medicine

## 2014-07-30 ENCOUNTER — Other Ambulatory Visit: Payer: Self-pay | Admitting: Internal Medicine

## 2014-07-30 ENCOUNTER — Ambulatory Visit: Payer: Self-pay

## 2014-08-04 ENCOUNTER — Other Ambulatory Visit: Payer: Self-pay

## 2014-08-04 ENCOUNTER — Emergency Department: Admit: 2014-08-04 | Disposition: A | Discharge status: Home / Self Care | Payer: Self-pay | Admitting: Student

## 2014-08-04 MED ORDER — OMEGA-3-ACID ETHYL ESTERS 1 G PO CAPS: 1.0000 g | ORAL_CAPSULE | Freq: Two times a day (BID) | ORAL | Status: DC

## 2014-08-15 ENCOUNTER — Encounter: Payer: Self-pay | Admitting: Internal Medicine

## 2014-08-15 ENCOUNTER — Ambulatory Visit: Payer: Self-pay | Attending: Internal Medicine | Admitting: Internal Medicine

## 2014-08-15 VITALS — BP 146/91 | Temp 98.3°F | Resp 18 | Ht 62.5 in | Wt 191.6 lb

## 2014-08-15 DIAGNOSIS — E559 Vitamin D deficiency, unspecified: Secondary | ICD-10-CM | POA: Insufficient documentation

## 2014-08-15 DIAGNOSIS — Z90710 Acquired absence of both cervix and uterus: Secondary | ICD-10-CM | POA: Insufficient documentation

## 2014-08-15 DIAGNOSIS — Z9049 Acquired absence of other specified parts of digestive tract: Secondary | ICD-10-CM | POA: Insufficient documentation

## 2014-08-15 DIAGNOSIS — Z794 Long term (current) use of insulin: Secondary | ICD-10-CM | POA: Insufficient documentation

## 2014-08-15 DIAGNOSIS — R0981 Nasal congestion: Secondary | ICD-10-CM | POA: Insufficient documentation

## 2014-08-15 DIAGNOSIS — E1165 Type 2 diabetes mellitus with hyperglycemia: Secondary | ICD-10-CM | POA: Insufficient documentation

## 2014-08-15 DIAGNOSIS — Z87891 Personal history of nicotine dependence: Secondary | ICD-10-CM | POA: Insufficient documentation

## 2014-08-15 DIAGNOSIS — Z7951 Long term (current) use of inhaled steroids: Secondary | ICD-10-CM | POA: Insufficient documentation

## 2014-08-15 DIAGNOSIS — E781 Pure hyperglyceridemia: Secondary | ICD-10-CM | POA: Insufficient documentation

## 2014-08-15 DIAGNOSIS — R739 Hyperglycemia, unspecified: Secondary | ICD-10-CM

## 2014-08-15 DIAGNOSIS — G4700 Insomnia, unspecified: Secondary | ICD-10-CM | POA: Insufficient documentation

## 2014-08-15 DIAGNOSIS — I10 Essential (primary) hypertension: Secondary | ICD-10-CM | POA: Insufficient documentation

## 2014-08-15 LAB — POCT URINALYSIS DIPSTICK
Blood, UA: NEGATIVE
Glucose, UA: NEGATIVE
Ketones, UA: NEGATIVE
Leukocytes, UA: NEGATIVE
NITRITE UA: NEGATIVE
PH UA: 5.5
Protein, UA: 30
Spec Grav, UA: >=1.03
Urobilinogen, UA: 0.2

## 2014-08-15 LAB — GLUCOSE, POCT (MANUAL RESULT ENTRY): POC GLUCOSE: 224 mg/dL — AB (ref 70–99)

## 2014-08-15 MED ORDER — SALINE SPRAY 0.65 % NA SOLN: 1.0000 | NASAL | Status: DC | PRN

## 2014-08-15 MED ORDER — DOXEPIN HCL 10 MG PO CAPS: 10.0000 mg | ORAL_CAPSULE | Freq: Every day | ORAL | Status: DC

## 2014-08-15 MED ORDER — DOXEPIN HCL 10 MG PO CAPS: 150.0000 mg | ORAL_CAPSULE | Freq: Every day | ORAL | Status: DC

## 2014-08-15 NOTE — Patient Instructions (Signed)
Diabetes Mellitus and Food It is important for you to manage your blood sugar (glucose) level. Your blood glucose level can be greatly affected by what you eat. Eating healthier foods in the appropriate amounts throughout the day at about the same time each day will help you control your blood glucose level. It can also help slow or prevent worsening of your diabetes mellitus. Healthy eating may even help you improve the level of your blood pressure and reach or maintain a healthy weight.  HOW CAN FOOD AFFECT ME? Carbohydrates Carbohydrates affect your blood glucose level more than any other type of food. Your dietitian will help you determine how many carbohydrates to eat at each meal and teach you how to count carbohydrates. Counting carbohydrates is important to keep your blood glucose at a healthy level, especially if you are using insulin or taking certain medicines for diabetes mellitus. Alcohol Alcohol can cause sudden decreases in blood glucose (hypoglycemia), especially if you use insulin or take certain medicines for diabetes mellitus. Hypoglycemia can be a life-threatening condition. Symptoms of hypoglycemia (sleepiness, dizziness, and disorientation) are similar to symptoms of having too much alcohol.  If your health care provider has given you approval to drink alcohol, do so in moderation and use the following guidelines:  Women should not have more than one drink per day, and men should not have more than two drinks per day. One drink is equal to:  12 oz of beer.  5 oz of wine.  1 oz of hard liquor.  Do not drink on an empty stomach.  Keep yourself hydrated. Have water, diet soda, or unsweetened iced tea.  Regular soda, juice, and other mixers might contain a lot of carbohydrates and should be counted. WHAT FOODS ARE NOT RECOMMENDED? As you make food choices, it is important to remember that all foods are not the same. Some foods have fewer nutrients per serving than other  foods, even though they might have the same number of calories or carbohydrates. It is difficult to get your body what it needs when you eat foods with fewer nutrients. Examples of foods that you should avoid that are high in calories and carbohydrates but low in nutrients include:  Trans fats (most processed foods list trans fats on the Nutrition Facts label).  Regular soda.  Juice.  Candy.  Sweets, such as cake, pie, doughnuts, and cookies.  Fried foods. WHAT FOODS CAN I EAT? Have nutrient-rich foods, which will nourish your body and keep you healthy. The food you should eat also will depend on several factors, including:  The calories you need.  The medicines you take.  Your weight.  Your blood glucose level.  Your blood pressure level.  Your cholesterol level. You also should eat a variety of foods, including:  Protein, such as meat, poultry, fish, tofu, nuts, and seeds (lean animal proteins are best).  Fruits.  Vegetables.  Dairy products, such as milk, cheese, and yogurt (low fat is best).  Breads, grains, pasta, cereal, rice, and beans.  Fats such as olive oil, trans fat-free margarine, canola oil, avocado, and olives. DOES EVERYONE WITH DIABETES MELLITUS HAVE THE SAME MEAL PLAN? Because every person with diabetes mellitus is different, there is not one meal plan that works for everyone. It is very important that you meet with a dietitian who will help you create a meal plan that is just right for you. Document Released: 01/06/2005 Document Revised: 04/16/2013 Document Reviewed: 03/08/2013 ExitCare Patient Information 2015 [employer]. This   information is not intended to replace advice given to you by your health care provider. Make sure you discuss any questions you have with your health care provider. DASH Eating Plan DASH stands for "Dietary Approaches to Stop Hypertension." The DASH eating plan is a healthy eating plan that has been shown to reduce high  blood pressure (hypertension). Additional health benefits may include reducing the risk of type 2 diabetes mellitus, heart disease, and stroke. The DASH eating plan may also help with weight loss. WHAT DO I NEED TO KNOW ABOUT THE DASH EATING PLAN? For the DASH eating plan, you will follow these general guidelines:  Choose foods with a percent daily value for sodium of less than 5% (as listed on the food label).  Use salt-free seasonings or herbs instead of table salt or sea salt.  Check with your health care provider or pharmacist before using salt substitutes.  Eat lower-sodium products, often labeled as "lower sodium" or "no salt added."  Eat fresh foods.  Eat more vegetables, fruits, and low-fat dairy products.  Choose whole grains. Look for the word "whole" as the first word in the ingredient list.  Choose fish and skinless chicken or turkey more often than red meat. Limit fish, poultry, and meat to 6 oz (170 g) each day.  Limit sweets, desserts, sugars, and sugary drinks.  Choose heart-healthy fats.  Limit cheese to 1 oz (28 g) per day.  Eat more home-cooked food and less restaurant, buffet, and fast food.  Limit fried foods.  Cook foods using methods other than frying.  Limit canned vegetables. If you do use them, rinse them well to decrease the sodium.  When eating at a restaurant, ask that your food be prepared with less salt, or no salt if possible. WHAT FOODS CAN I EAT? Seek help from a dietitian for individual calorie needs. Grains Whole grain or whole wheat bread. Brown rice. Whole grain or whole wheat pasta. Quinoa, bulgur, and whole grain cereals. Low-sodium cereals. Corn or whole wheat flour tortillas. Whole grain cornbread. Whole grain crackers. Low-sodium crackers. Vegetables Fresh or frozen vegetables (raw, steamed, roasted, or grilled). Low-sodium or reduced-sodium tomato and vegetable juices. Low-sodium or reduced-sodium tomato sauce and paste. Low-sodium  or reduced-sodium canned vegetables.  Fruits All fresh, canned (in natural juice), or frozen fruits. Meat and Other Protein Products Ground beef (85% or leaner), grass-fed beef, or beef trimmed of fat. Skinless chicken or turkey. Ground chicken or turkey. Pork trimmed of fat. All fish and seafood. Eggs. Dried beans, peas, or lentils. Unsalted nuts and seeds. Unsalted canned beans. Dairy Low-fat dairy products, such as skim or 1% milk, 2% or reduced-fat cheeses, low-fat ricotta or cottage cheese, or plain low-fat yogurt. Low-sodium or reduced-sodium cheeses. Fats and Oils Tub margarines without trans fats. Light or reduced-fat mayonnaise and salad dressings (reduced sodium). Avocado. Safflower, olive, or canola oils. Natural peanut or almond butter. Other Unsalted popcorn and pretzels. The items listed above may not be a complete list of recommended foods or beverages. Contact your dietitian for more options. WHAT FOODS ARE NOT RECOMMENDED? Grains White bread. White pasta. White rice. Refined cornbread. Bagels and croissants. Crackers that contain trans fat. Vegetables Creamed or fried vegetables. Vegetables in a cheese sauce. Regular canned vegetables. Regular canned tomato sauce and paste. Regular tomato and vegetable juices. Fruits Dried fruits. Canned fruit in light or heavy syrup. Fruit juice. Meat and Other Protein Products Fatty cuts of meat. Ribs, chicken wings, bacon, sausage, bologna, salami, chitterlings, fatback, hot   dogs, bratwurst, and packaged luncheon meats. Salted nuts and seeds. Canned beans with salt. Dairy Whole or 2% milk, cream, half-and-half, and cream cheese. Whole-fat or sweetened yogurt. Full-fat cheeses or blue cheese. Nondairy creamers and whipped toppings. Processed cheese, cheese spreads, or cheese curds. Condiments Onion and garlic salt, seasoned salt, table salt, and sea salt. Canned and packaged gravies. Worcestershire sauce. Tartar sauce. Barbecue sauce.  Teriyaki sauce. Soy sauce, including reduced sodium. Steak sauce. Fish sauce. Oyster sauce. Cocktail sauce. Horseradish. Ketchup and mustard. Meat flavorings and tenderizers. Bouillon cubes. Hot sauce. Tabasco sauce. Marinades. Taco seasonings. Relishes. Fats and Oils Butter, stick margarine, lard, shortening, ghee, and bacon fat. Coconut, palm kernel, or palm oils. Regular salad dressings. Other Pickles and olives. Salted popcorn and pretzels. The items listed above may not be a complete list of foods and beverages to avoid. Contact your dietitian for more information. WHERE CAN I FIND MORE INFORMATION? National Heart, [employer], [employer] Institute: www.nhlbi.nih.gov/health/health-topics/topics/dash/ Document Released: 03/31/2011 Document Revised: 08/26/2013 Document Reviewed: 02/13/2013 ExitCare Patient Information 2015 [employer]. This information is not intended to replace advice given to you by your health care provider. Make sure you discuss any questions you have with your health care provider.  

## 2014-08-15 NOTE — Progress Notes (Signed)
Patient complaining of cough, congestion x 2 weeks. Went to ED 1.5 weeks ago.  Completed Zpack, prednisone. Lightheadedness and dizziness started yesterday. Orthostatic VS taken. Clear phlegm. Intermittent pain across chest since sick, denies any at this time. Patient feels she may have a yeast infection- "it hurts to wipe" per patient. No dysuria. CBG-224. Patient has eaten breakfast and given 20U of novolog 70/30 this am.

## 2014-08-15 NOTE — Progress Notes (Signed)
MRN: [id number] Name: Sara Vasquez  Sex: female Age: 49 y.o. DOB: 1966/01/11  Allergies: Fentanyl; Metformin and related; and Phenergan  Chief Complaint  Patient presents with  . Cough  . Nasal Congestion    HPI: Patient is 49 y.o. female who hashistory of diabetes hypertension, comes today complaining of nasal congestion minimal productive cough, as per patient 10 days ago she went to the emergency room [facility] and was diagnosed with bronchitis and was prescribed Z-Pak prednisone and cough medication, currently denies any fever chills occasionally has chest pain likely secondary to cough today her blood pressure is borderline elevated she does report occasional dizziness or denies as per patient when she was taking prednisone her blood sugar level was elevated, as per patient she is currently taking 20 units of insulin twice a day, as per her our previous discussion on the last visit she was supposed to take 22 units twice a day, previous blood work reviewed with the patient noticed elevated triglycerides, low vitamin D.patient currently denies any urinary symptoms, her UA is negative for infection.patient is also complaining of problem with the sleep and has tried over-the-counter melatonin without much improvement.  History reviewed. No pertinent past medical history.  Past Surgical History  Procedure Laterality Date  . Abdominal hysterectomy    . Back surgery    . Cholecystectomy        Medication List       This list is accurate as of: 08/15/14 10:13 AM.  Always use your most recent med list.               benzonatate 100 MG capsule  Commonly known as:  TESSALON  Take 1 capsule (100 mg total) by mouth 3 (three) times daily as needed for cough.     buPROPion 150 MG 24 hr tablet  Commonly known as:  WELLBUTRIN XL  Take 1 tablet (150 mg total) by mouth daily.     doxepin 10 MG capsule  Commonly known as:  SINEQUAN  Take 15 capsules (150 mg total) by mouth  at bedtime.     fluconazole 150 MG tablet  Commonly known as:  DIFLUCAN  Take 1 tablet (150 mg total) by mouth once.     fluticasone 50 MCG/ACT nasal spray  Commonly known as:  FLONASE  Place 2 sprays into both nostrils daily.     gemfibrozil 600 MG tablet  Commonly known as:  LOPID  Take 1 tablet (600 mg total) by mouth 2 (two) times daily before a meal.     glimepiride 4 MG tablet  Commonly known as:  AMARYL  TAKE 1 TABLET BY MOUTH DAILY WITH BREAKFAST.     glucose blood test strip  Commonly known as:  CHOICE DM FORA G20 TEST STRIPS  Use as instructed     insulin aspart protamine- aspart (70-30) 100 UNIT/ML injection  Commonly known as:  NOVOLOG MIX 70/30  Inject 0.15 mLs (15 Units total) into the skin 2 (two) times daily with a meal.     omega-3 acid ethyl esters 1 G capsule  Commonly known as:  LOVAZA  Take 1 capsule (1 g total) by mouth 2 (two) times daily.     ramipril 2.5 MG capsule  Commonly known as:  ALTACE  Take 1 capsule (2.5 mg total) by mouth daily.     sodium chloride 0.65 % Soln nasal spray  Commonly known as:  OCEAN  Place 1 spray into both nostrils as  needed for congestion.     Vitamin D (Ergocalciferol) 50000 UNITS Caps capsule  Commonly known as:  DRISDOL  Take 1 capsule (50,000 Units total) by mouth every 7 (seven) days.     Vitamin D (Ergocalciferol) 50000 UNITS Caps capsule  Commonly known as:  DRISDOL  Take 1 capsule (50,000 Units total) by mouth every 7 (seven) days.        Meds ordered this encounter  Medications  . sodium chloride (OCEAN) 0.65 % SOLN nasal spray    Sig: Place 1 spray into both nostrils as needed for congestion.    Dispense:  15 mL    Refill:  3  . doxepin (SINEQUAN) 10 MG capsule    Sig: Take 15 capsules (150 mg total) by mouth at bedtime.    Dispense:  30 capsule    Refill:  3    Immunization History  Administered Date(s) Administered  . Influenza,inj,Quad PF,36+ Mos 04/08/2014  . Pneumococcal  Polysaccharide-23 04/08/2014    Family History  Problem Relation Age of Onset  . Diabetes Mother   . Heart disease Mother   . Hypertension Father   . Heart disease Father   . Cancer Paternal Aunt     lung cancer  . Cancer Maternal Grandmother     breast cancer  . Diabetes Maternal Grandfather     History  Substance Use Topics  . Smoking status: Former Smoker -- 1.00 packs/day for 35 years    Start date: 03/27/2014  . Smokeless tobacco: Not on file  . Alcohol Use: No    Review of Systems   As noted in HPI  Filed Vitals:   08/15/14 0923  BP: 146/91  Temp: 98.3 F (36.8 C)  Resp: 18    Physical Exam  Physical Exam  Constitutional: No distress.  HENT:  Nasal congestion is tenderness, both TMs visualized not congested.minimal pharyngeal erythema no exudate  Eyes: EOM are normal. Pupils are equal, round, and reactive to light.  Cardiovascular: Normal rate and regular rhythm.   Pulmonary/Chest: Breath sounds normal. No respiratory distress. She has no wheezes. She has no rales.  Musculoskeletal: She exhibits no edema.    CBC    Component Value Date/Time   WBC 11.4* 12/19/2013 1005   RBC 5.14* 12/19/2013 1005   HGB 15.6* 12/19/2013 1005   HCT 45.0 12/19/2013 1005   PLT 318 12/19/2013 1005   MCV 87.5 12/19/2013 1005   LYMPHSABS 4.7* 12/19/2013 1005   MONOABS 0.6 12/19/2013 1005   EOSABS 0.2 12/19/2013 1005   BASOSABS 0.0 12/19/2013 1005    CMP     Component Value Date/Time   NA 136 07/03/2014 1111   K 4.6 07/03/2014 1111   CL 98 07/03/2014 1111   CO2 24 07/03/2014 1111   GLUCOSE 191* 07/03/2014 1111   BUN 13 07/03/2014 1111   CREATININE 0.86 07/03/2014 1111   CALCIUM 10.1 07/03/2014 1111   PROT 8.0 07/03/2014 1111   ALBUMIN 4.9 07/03/2014 1111   AST 40* 07/03/2014 1111   ALT 55* 07/03/2014 1111   ALKPHOS 58 07/03/2014 1111   BILITOT 0.5 07/03/2014 1111   GFRNONAA 80 07/03/2014 1111   GFRAA >89 07/03/2014 1111    Lab Results  Component Value  Date/Time   CHOL 297* 07/03/2014 11:11 AM    Lab Results  Component Value Date/Time   HGBA1C 8.10 07/03/2014 10:28 AM    Lab Results  Component Value Date/Time   AST 40* 07/03/2014 11:11 AM    Assessment and  Plan  Hyperglycemia - Plan:  Results for orders placed or performed in visit on 08/15/14  Glucose (CBG)  Result Value Ref Range   POC Glucose 224 (A) 70 - 99 mg/dl  POCT urinalysis dipstick  Result Value Ref Range   Color, UA yellow    Clarity, UA clear    Glucose, UA neg    Bilirubin, UA small    Ketones, UA neg    Spec Grav, UA >=1.030    Blood, UA neg    pH, UA 5.5    Protein, UA 30    Urobilinogen, UA 0.2    Nitrite, UA neg    Leukocytes, UA Negative   urine is negative for any infection, I have advised patient to increase the dose of insulin to 22 units twice a day, continue with Amaryl, will recheck A1c in 3 months  Nasal congestion - Plan: sodium chloride (OCEAN) 0.65 % SOLN nasal spray  Hypertriglyceridemia Patient has not started taking Lovaza yet she will get the medication from our pharmacy  Insomnia - Plan: advised for sleep hygiene, trial of  doxepin (SINEQUAN) 10 MG capsule each bedtime  Vitamin D deficiency Continue with vitamin D supplement.   Return in about 3 months (around 11/14/2014), or if symptoms worsen or fail to improve, for diabetes.   This note has been created with Education officer, environmental. Any transcriptional errors are unintentional.    Doris Cheadle, MD

## 2014-08-20 ENCOUNTER — Other Ambulatory Visit: Payer: Self-pay | Admitting: Internal Medicine

## 2014-11-03 ENCOUNTER — Other Ambulatory Visit: Payer: Self-pay | Admitting: Internal Medicine

## 2014-11-03 DIAGNOSIS — E119 Type 2 diabetes mellitus without complications: Secondary | ICD-10-CM

## 2014-11-14 ENCOUNTER — Other Ambulatory Visit: Payer: Self-pay | Admitting: Internal Medicine

## 2014-11-17 ENCOUNTER — Encounter: Payer: Self-pay | Admitting: Internal Medicine

## 2014-11-17 ENCOUNTER — Ambulatory Visit: Payer: Self-pay | Attending: Internal Medicine | Admitting: Internal Medicine

## 2014-11-17 VITALS — BP 137/87 | HR 72 | Temp 98.0°F | Resp 16 | Wt 190.0 lb

## 2014-11-17 DIAGNOSIS — F17200 Nicotine dependence, unspecified, uncomplicated: Secondary | ICD-10-CM

## 2014-11-17 DIAGNOSIS — Z79899 Other long term (current) drug therapy: Secondary | ICD-10-CM | POA: Insufficient documentation

## 2014-11-17 DIAGNOSIS — G4700 Insomnia, unspecified: Secondary | ICD-10-CM | POA: Insufficient documentation

## 2014-11-17 DIAGNOSIS — Z794 Long term (current) use of insulin: Secondary | ICD-10-CM | POA: Insufficient documentation

## 2014-11-17 DIAGNOSIS — I10 Essential (primary) hypertension: Secondary | ICD-10-CM | POA: Insufficient documentation

## 2014-11-17 DIAGNOSIS — E139 Other specified diabetes mellitus without complications: Secondary | ICD-10-CM

## 2014-11-17 DIAGNOSIS — F17210 Nicotine dependence, cigarettes, uncomplicated: Secondary | ICD-10-CM | POA: Insufficient documentation

## 2014-11-17 DIAGNOSIS — Z720 Tobacco use: Secondary | ICD-10-CM

## 2014-11-17 DIAGNOSIS — E781 Pure hyperglyceridemia: Secondary | ICD-10-CM | POA: Insufficient documentation

## 2014-11-17 DIAGNOSIS — E119 Type 2 diabetes mellitus without complications: Secondary | ICD-10-CM | POA: Insufficient documentation

## 2014-11-17 DIAGNOSIS — F32A Depression, unspecified: Secondary | ICD-10-CM

## 2014-11-17 DIAGNOSIS — F329 Major depressive disorder, single episode, unspecified: Secondary | ICD-10-CM | POA: Insufficient documentation

## 2014-11-17 LAB — POCT GLYCOSYLATED HEMOGLOBIN (HGB A1C): HEMOGLOBIN A1C: 8.3

## 2014-11-17 LAB — GLUCOSE, POCT (MANUAL RESULT ENTRY): POC Glucose: 223 mg/dl — AB (ref 70–99)

## 2014-11-17 MED ORDER — INSULIN ASPART PROT & ASPART (70-30 MIX) 100 UNIT/ML ~~LOC~~ SUSP: 23.0000 [IU] | Freq: Two times a day (BID) | SUBCUTANEOUS | Status: DC

## 2014-11-17 MED ORDER — OMEGA-3-ACID ETHYL ESTERS 1 G PO CAPS: 1.0000 g | ORAL_CAPSULE | Freq: Two times a day (BID) | ORAL | Status: DC

## 2014-11-17 NOTE — Progress Notes (Signed)
Patient here for routine follow up on her diabetes and to check  Her A1C Patient did state she has been very depressed lately

## 2014-11-17 NOTE — Progress Notes (Signed)
MRN: [id number] Name: Sara Vasquez  Sex: female Age: 49 y.o. DOB: May 16, 1965  Allergies: Fentanyl; Metformin and related; and Phenergan  Chief Complaint  Patient presents with  . Follow-up    HPI: Patient is 49 y.o. female who has to of diabetes, hypertension, insomnia/depression, comes today for followup as per patient she has been taking insulin 22 units twice a day, she denies any hypoglycemic symptoms, patient has not been checking her blood sugar regularly, previous blood work reviewed with the patient apparently she was supposed to take Lovaza for  Hypertriglyceridemia, she has not been taking that medication,patient is also complaining of having more symptoms of depression, denies any SI or HI, she is on bupropion to help her with  depression as well as quit smoking. Patient will schedule appointment with social worker.  History reviewed. No pertinent past medical history.  Past Surgical History  Procedure Laterality Date  . Abdominal hysterectomy    . Back surgery    . Cholecystectomy        Medication List       This list is accurate as of: 11/17/14 10:19 AM.  Always use your most recent med list.               benzonatate 100 MG capsule  Commonly known as:  TESSALON  Take 1 capsule (100 mg total) by mouth 3 (three) times daily as needed for cough.     buPROPion 150 MG 24 hr tablet  Commonly known as:  WELLBUTRIN XL  TAKE 1 TABLET BY MOUTH DAILY     doxepin 10 MG capsule  Commonly known as:  SINEQUAN  Take 1 capsule (10 mg total) by mouth at bedtime.     fluconazole 150 MG tablet  Commonly known as:  DIFLUCAN  Take 1 tablet (150 mg total) by mouth once.     fluticasone 50 MCG/ACT nasal spray  Commonly known as:  FLONASE  Place 2 sprays into both nostrils daily.     gemfibrozil 600 MG tablet  Commonly known as:  LOPID  Take 1 tablet (600 mg total) by mouth 2 (two) times daily before a meal.     glimepiride 4 MG tablet  Commonly known as:  AMARYL    TAKE 1 TABLET BY MOUTH DAILY WITH BREAKFAST.     glucose blood test strip  Commonly known as:  CHOICE DM FORA G20 TEST STRIPS  Use as instructed     insulin aspart protamine- aspart (70-30) 100 UNIT/ML injection  Commonly known as:  NOVOLOG MIX 70/30  Inject 0.23 mLs (23 Units total) into the skin 2 (two) times daily with a meal.     omega-3 acid ethyl esters 1 G capsule  Commonly known as:  LOVAZA  Take 1 capsule (1 g total) by mouth 2 (two) times daily.     ramipril 2.5 MG capsule  Commonly known as:  ALTACE  Take 1 capsule (2.5 mg total) by mouth daily.     sodium chloride 0.65 % Soln nasal spray  Commonly known as:  OCEAN  Place 1 spray into both nostrils as needed for congestion.     Vitamin D (Ergocalciferol) 50000 UNITS Caps capsule  Commonly known as:  DRISDOL  Take 1 capsule (50,000 Units total) by mouth every 7 (seven) days.     Vitamin D (Ergocalciferol) 50000 UNITS Caps capsule  Commonly known as:  DRISDOL  Take 1 capsule (50,000 Units total) by mouth every 7 (seven) days.  Meds ordered this encounter  Medications  . insulin aspart protamine- aspart (NOVOLOG MIX 70/30) (70-30) 100 UNIT/ML injection    Sig: Inject 0.23 mLs (23 Units total) into the skin 2 (two) times daily with a meal.    Dispense:  1 vial    Refill:  3  . omega-3 acid ethyl esters (LOVAZA) 1 G capsule    Sig: Take 1 capsule (1 g total) by mouth 2 (two) times daily.    Dispense:  180 capsule    Refill:  3    Immunization History  Administered Date(s) Administered  . Influenza,inj,Quad PF,36+ Mos 04/08/2014  . Pneumococcal Polysaccharide-23 04/08/2014    Family History  Problem Relation Age of Onset  . Diabetes Mother   . Heart disease Mother   . Hypertension Father   . Heart disease Father   . Cancer Paternal Aunt     lung cancer  . Cancer Maternal Grandmother     breast cancer  . Diabetes Maternal Grandfather     History  Substance Use Topics  . Smoking status:  Former Smoker -- 1.00 packs/day for 35 years    Start date: 03/27/2014  . Smokeless tobacco: Not on file  . Alcohol Use: No    Review of Systems   As noted in HPI  Filed Vitals:   11/17/14 0915  BP: 137/87  Pulse: 72  Temp: 98 F (36.7 C)  Resp: 16    Physical Exam  Physical Exam  Constitutional: No distress.  Eyes: EOM are normal. Pupils are equal, round, and reactive to light.  Cardiovascular: Normal rate and regular rhythm.   Pulmonary/Chest: Breath sounds normal. No respiratory distress. She has no wheezes. She has no rales.  Musculoskeletal: She exhibits no edema.    CBC    Component Value Date/Time   WBC 11.4* 12/19/2013 1005   WBC 6.5 07/07/2013 0034   RBC 5.14* 12/19/2013 1005   RBC 5.08 07/07/2013 0034   HGB 15.6* 12/19/2013 1005   HGB 15.2 07/07/2013 0034   HCT 45.0 12/19/2013 1005   HCT 45.9 07/07/2013 0034   PLT 318 12/19/2013 1005   PLT 257 07/07/2013 0034   MCV 87.5 12/19/2013 1005   MCV 90 07/07/2013 0034   LYMPHSABS 4.7* 12/19/2013 1005   MONOABS 0.6 12/19/2013 1005   EOSABS 0.2 12/19/2013 1005   BASOSABS 0.0 12/19/2013 1005    CMP     Component Value Date/Time   NA 136 07/03/2014 1111   NA 132* 07/07/2013 0034   K 4.6 07/03/2014 1111   K 4.0 07/07/2013 0034   CL 98 07/03/2014 1111   CL 101 07/07/2013 0034   CO2 24 07/03/2014 1111   CO2 20* 07/07/2013 0034   GLUCOSE 191* 07/03/2014 1111   GLUCOSE 384* 07/07/2013 0034   BUN 13 07/03/2014 1111   BUN 10 07/07/2013 0034   CREATININE 0.86 07/03/2014 1111   CREATININE 1.20 07/07/2013 0034   CALCIUM 10.1 07/03/2014 1111   CALCIUM 9.5 07/07/2013 0034   PROT 8.0 07/03/2014 1111   ALBUMIN 4.9 07/03/2014 1111   AST 40* 07/03/2014 1111   ALT 55* 07/03/2014 1111   ALKPHOS 58 07/03/2014 1111   BILITOT 0.5 07/03/2014 1111   GFRNONAA 80 07/03/2014 1111   GFRNONAA 54* 07/07/2013 0034   GFRAA >89 07/03/2014 1111   GFRAA >60 07/07/2013 0034    Lab Results  Component Value Date/Time    CHOL 297* 07/03/2014 11:11 AM    Lab Results  Component Value Date/Time  HGBA1C 8.30 11/17/2014 09:17 AM    Lab Results  Component Value Date/Time   AST 40* 07/03/2014 11:11 AM    Assessment and Plan  Other specified diabetes mellitus without complications - Plan:  Results for orders placed or performed in visit on 11/17/14  Glucose (CBG)  Result Value Ref Range   POC Glucose 223 (A) 70 - 99 mg/dl  HgB W0J  Result Value Ref Range   Hemoglobin A1C 8.30    Patient will increased the dose of insulin to 23 unitstwice a day, advise patient for diabetes meal planning, repeat A1c in 3 months. insulin aspart protamine- aspart (NOVOLOG MIX 70/30) (70-30) 100 UNIT/ML injection  Depression/Smoking  Advised patient to quit smoking, Continue with Wellbutrin, patient was also follows with our Child psychotherapist  Hypertriglyceridemia - Plan:Advise patient for low fat diet, started on  omega-3 acid ethyl esters (LOVAZA) 1 G capsule, check fasting lipid panel on the next visit  Essential hypertension, benign Blood pressure is well controlled continue with current meds.  Insomnia Patient is taking doxepin to help with sleep.   Return in about 3 months (around 02/17/2015), or if symptoms worsen or fail to improve.   This note has been created with Education officer, environmental. Any transcriptional errors are unintentional.    Doris Cheadle, MD

## 2014-11-17 NOTE — Patient Instructions (Signed)
DASH Eating Plan DASH stands for "Dietary Approaches to Stop Hypertension." The DASH eating plan is a healthy eating plan that has been shown to reduce high blood pressure (hypertension). Additional health benefits may include reducing the risk of type 2 diabetes mellitus, heart disease, and stroke. The DASH eating plan may also help with weight loss. WHAT DO I NEED TO KNOW ABOUT THE DASH EATING PLAN? For the DASH eating plan, you will follow these general guidelines:  Choose foods with a percent daily value for sodium of less than 5% (as listed on the food label).  Use salt-free seasonings or herbs instead of table salt or sea salt.  Check with your health care provider or pharmacist before using salt substitutes.  Eat lower-sodium products, often labeled as "lower sodium" or "no salt added."  Eat fresh foods.  Eat more vegetables, fruits, and low-fat dairy products.  Choose whole grains. Look for the word "whole" as the first word in the ingredient list.  Choose fish and skinless chicken or turkey more often than red meat. Limit fish, poultry, and meat to 6 oz (170 g) each day.  Limit sweets, desserts, sugars, and sugary drinks.  Choose heart-healthy fats.  Limit cheese to 1 oz (28 g) per day.  Eat more home-cooked food and less restaurant, buffet, and fast food.  Limit fried foods.  Cook foods using methods other than frying.  Limit canned vegetables. If you do use them, rinse them well to decrease the sodium.  When eating at a restaurant, ask that your food be prepared with less salt, or no salt if possible. WHAT FOODS CAN I EAT? Seek help from a dietitian for individual calorie needs. Grains Whole grain or whole wheat bread. Brown rice. Whole grain or whole wheat pasta. Quinoa, bulgur, and whole grain cereals. Low-sodium cereals. Corn or whole wheat flour tortillas. Whole grain cornbread. Whole grain crackers. Low-sodium crackers. Vegetables Fresh or frozen vegetables  (raw, steamed, roasted, or grilled). Low-sodium or reduced-sodium tomato and vegetable juices. Low-sodium or reduced-sodium tomato sauce and paste. Low-sodium or reduced-sodium canned vegetables.  Fruits All fresh, canned (in natural juice), or frozen fruits. Meat and Other Protein Products Ground beef (85% or leaner), grass-fed beef, or beef trimmed of fat. Skinless chicken or turkey. Ground chicken or turkey. Pork trimmed of fat. All fish and seafood. Eggs. Dried beans, peas, or lentils. Unsalted nuts and seeds. Unsalted canned beans. Dairy Low-fat dairy products, such as skim or 1% milk, 2% or reduced-fat cheeses, low-fat ricotta or cottage cheese, or plain low-fat yogurt. Low-sodium or reduced-sodium cheeses. Fats and Oils Tub margarines without trans fats. Light or reduced-fat mayonnaise and salad dressings (reduced sodium). Avocado. Safflower, olive, or canola oils. Natural peanut or almond butter. Other Unsalted popcorn and pretzels. The items listed above may not be a complete list of recommended foods or beverages. Contact your dietitian for more options. WHAT FOODS ARE NOT RECOMMENDED? Grains White bread. White pasta. White rice. Refined cornbread. Bagels and croissants. Crackers that contain trans fat. Vegetables Creamed or fried vegetables. Vegetables in a cheese sauce. Regular canned vegetables. Regular canned tomato sauce and paste. Regular tomato and vegetable juices. Fruits Dried fruits. Canned fruit in light or heavy syrup. Fruit juice. Meat and Other Protein Products Fatty cuts of meat. Ribs, chicken wings, bacon, sausage, bologna, salami, chitterlings, fatback, hot dogs, bratwurst, and packaged luncheon meats. Salted nuts and seeds. Canned beans with salt. Dairy Whole or 2% milk, cream, half-and-half, and cream cheese. Whole-fat or sweetened yogurt. Full-fat   cheeses or blue cheese. Nondairy creamers and whipped toppings. Processed cheese, cheese spreads, or cheese  curds. Condiments Onion and garlic salt, seasoned salt, table salt, and sea salt. Canned and packaged gravies. Worcestershire sauce. Tartar sauce. Barbecue sauce. Teriyaki sauce. Soy sauce, including reduced sodium. Steak sauce. Fish sauce. Oyster sauce. Cocktail sauce. Horseradish. Ketchup and mustard. Meat flavorings and tenderizers. Bouillon cubes. Hot sauce. Tabasco sauce. Marinades. Taco seasonings. Relishes. Fats and Oils Butter, stick margarine, lard, shortening, ghee, and bacon fat. Coconut, palm kernel, or palm oils. Regular salad dressings. Other Pickles and olives. Salted popcorn and pretzels. The items listed above may not be a complete list of foods and beverages to avoid. Contact your dietitian for more information. WHERE CAN I FIND MORE INFORMATION? National Heart, Lung, and [employer] Institute: www.nhlbi.nih.gov/health/health-topics/topics/dash/ Document Released: 03/31/2011 Document Revised: 08/26/2013 Document Reviewed: 02/13/2013 ExitCare Patient Information 2015 [employer]. This information is not intended to replace advice given to you by your health care provider. Make sure you discuss any questions you have with your health care provider. Diabetes Mellitus and Food It is important for you to manage your blood sugar (glucose) level. Your blood glucose level can be greatly affected by what you eat. Eating healthier foods in the appropriate amounts throughout the day at about the same time each day will help you control your blood glucose level. It can also help slow or prevent worsening of your diabetes mellitus. Healthy eating may even help you improve the level of your blood pressure and reach or maintain a healthy weight.  HOW CAN FOOD AFFECT ME? Carbohydrates Carbohydrates affect your blood glucose level more than any other type of food. Your dietitian will help you determine how many carbohydrates to eat at each meal and teach you how to count carbohydrates. Counting  carbohydrates is important to keep your blood glucose at a healthy level, especially if you are using insulin or taking certain medicines for diabetes mellitus. Alcohol Alcohol can cause sudden decreases in blood glucose (hypoglycemia), especially if you use insulin or take certain medicines for diabetes mellitus. Hypoglycemia can be a life-threatening condition. Symptoms of hypoglycemia (sleepiness, dizziness, and disorientation) are similar to symptoms of having too much alcohol.  If your health care provider has given you approval to drink alcohol, do so in moderation and use the following guidelines:  Women should not have more than one drink per day, and men should not have more than two drinks per day. One drink is equal to:  12 oz of beer.  5 oz of wine.  1 oz of hard liquor.  Do not drink on an empty stomach.  Keep yourself hydrated. Have water, diet soda, or unsweetened iced tea.  Regular soda, juice, and other mixers might contain a lot of carbohydrates and should be counted. WHAT FOODS ARE NOT RECOMMENDED? As you make food choices, it is important to remember that all foods are not the same. Some foods have fewer nutrients per serving than other foods, even though they might have the same number of calories or carbohydrates. It is difficult to get your body what it needs when you eat foods with fewer nutrients. Examples of foods that you should avoid that are high in calories and carbohydrates but low in nutrients include:  Trans fats (most processed foods list trans fats on the Nutrition Facts label).  Regular soda.  Juice.  Candy.  Sweets, such as cake, pie, doughnuts, and cookies.  Fried foods. WHAT FOODS CAN I EAT? Have nutrient-rich foods,   which will nourish your body and keep you healthy. The food you should eat also will depend on several factors, including:  The calories you need.  The medicines you take.  Your weight.  Your blood glucose level.  Your  blood pressure level.  Your cholesterol level. You also should eat a variety of foods, including:  Protein, such as meat, poultry, fish, tofu, nuts, and seeds (lean animal proteins are best).  Fruits.  Vegetables.  Dairy products, such as milk, cheese, and yogurt (low fat is best).  Breads, grains, pasta, cereal, rice, and beans.  Fats such as olive oil, trans fat-free margarine, canola oil, avocado, and olives. DOES EVERYONE WITH DIABETES MELLITUS HAVE THE SAME MEAL PLAN? Because every person with diabetes mellitus is different, there is not one meal plan that works for everyone. It is very important that you meet with a dietitian who will help you create a meal plan that is just right for you. Document Released: 01/06/2005 Document Revised: 04/16/2013 Document Reviewed: 03/08/2013 ExitCare Patient Information 2015 [employer]. This information is not intended to replace advice given to you by your health care provider. Make sure you discuss any questions you have with your health care provider.  

## 2014-11-25 ENCOUNTER — Ambulatory Visit: Payer: Self-pay | Attending: Internal Medicine | Admitting: Clinical

## 2014-11-25 DIAGNOSIS — F418 Other specified anxiety disorders: Secondary | ICD-10-CM

## 2014-11-25 DIAGNOSIS — F329 Major depressive disorder, single episode, unspecified: Secondary | ICD-10-CM

## 2014-11-25 DIAGNOSIS — F419 Anxiety disorder, unspecified: Principal | ICD-10-CM

## 2014-11-25 NOTE — Progress Notes (Signed)
ASSESSMENT: Pt currently experiencing symptoms of anxiety and depression. Pt needs to continue taking her BH medications, f/u with PCP and [facility] as needed; would benefit with psychoeducation and supportive counseling regarding coping with symptoms of anxiety and depression.  Stage of Change: contemplative  PLAN: 1. F/U with behavioral health consultant in as needed, or three months  2. Psychiatric Medications: Wellbutrin, Sinequan. 3. Behavioral recommendation(s):   -Consider reading educational material regarding coping with symptoms of anxiety and depression -Call [employer] to have them send information -Consider cutting back on cigarettes, and put $ saved into [place] trip fund SUBJECTIVE: Pt. referred by Dr Orpah Cobb for symptoms of anxiety and depression:  Pt. reports the following symptoms/concerns: Pt says she is overwhelmed by family issues; has been diagnosed with clinical depression "many years ago", smokes 1.5 packs cigarettes/day, and feels much anxiety over things outside of her control, adjusting to all children now out of house. Her dream would be a trip to [place], but says she has no money to pay for a trip.  Duration of problem: past month Severity: moderate  OBJECTIVE: Orientation & Cognition: Oriented x3. Thought processes normal and appropriate to situation. Mood: teary. Affect: appropriate Appearance: appropriate Risk of harm to self or others: no risk of harm to self or others Substance use: tobacco Assessments administered: PHQ9-18  Diagnosis: Anxiety and depression CPT Code: F41.8 -------------------------------------------- Other(s) present in the room: none  Time spent with patient in exam room: 60 minutes

## 2015-01-20 ENCOUNTER — Encounter (HOSPITAL_COMMUNITY): Payer: Self-pay | Admitting: Emergency Medicine

## 2015-01-20 ENCOUNTER — Emergency Department (INDEPENDENT_AMBULATORY_CARE_PROVIDER_SITE_OTHER)
Admission: EM | Admit: 2015-01-20 | Discharge: 2015-01-20 | Disposition: A | Discharge status: Home / Self Care | Payer: Self-pay | Source: Home / Self Care | Attending: Emergency Medicine | Admitting: Emergency Medicine

## 2015-01-20 DIAGNOSIS — J40 Bronchitis, not specified as acute or chronic: Secondary | ICD-10-CM

## 2015-01-20 DIAGNOSIS — J0140 Acute pansinusitis, unspecified: Secondary | ICD-10-CM

## 2015-01-20 HISTORY — DX: Essential (primary) hypertension: I10

## 2015-01-20 HISTORY — DX: Type 2 diabetes mellitus without complications: E11.9

## 2015-01-20 MED ORDER — BENZONATATE 100 MG PO CAPS: 100.0000 mg | ORAL_CAPSULE | Freq: Three times a day (TID) | ORAL | Status: DC | PRN

## 2015-01-20 MED ORDER — AZITHROMYCIN 250 MG PO TABS: ORAL_TABLET | ORAL | Status: DC

## 2015-01-20 MED ORDER — PREDNISONE 50 MG PO TABS: ORAL_TABLET | ORAL | Status: DC

## 2015-01-20 NOTE — ED Notes (Signed)
Pt has been suffering from nasal congestion and sinus pressure and pain since Sunday.  Today she states she started with chest congestion and a cough.  She denies any fever.

## 2015-01-20 NOTE — ED Provider Notes (Signed)
CSN: [id number]     Arrival date & time 01/20/15  1305 History   First MD Initiated Contact with Patient 01/20/15 1327     Chief Complaint  Patient presents with  . URI   (Consider location/radiation/quality/duration/timing/severity/associated sxs/prior Treatment) HPI  She is a 49 year old woman here for evaluation of cough. She states her symptoms started 3 days ago with nasal congestion, rhinorrhea, and sinus pressure. She also reports feeling like her ears are draining. She denies any ear pain or difficulty hearing. No sore throat. This morning, she developed some chest congestion and a cough. The cough is occasionally productive of sputum. She reports some intermittent shortness of breath. No wheezing or chest pain. She has not tried any medications. She tried calling her doctor, but was unable to be seen until next week.  Past Medical History  Diagnosis Date  . Diabetes mellitus without complication   . Hypertension    Past Surgical History  Procedure Laterality Date  . Abdominal hysterectomy    . Back surgery    . Cholecystectomy     Family History  Problem Relation Age of Onset  . Diabetes Mother   . Heart disease Mother   . Hypertension Father   . Heart disease Father   . Cancer Paternal Aunt     lung cancer  . Cancer Maternal Grandmother     breast cancer  . Diabetes Maternal Grandfather    Social History  Substance Use Topics  . Smoking status: Current Every Day Smoker -- 1.00 packs/day for 35 years    Types: Cigarettes    Start date: 03/27/2014  . Smokeless tobacco: None  . Alcohol Use: No   OB History    No data available     Review of Systems As in history of present illness Allergies  Fentanyl; Metformin and related; and Phenergan  Home Medications   Prior to Admission medications   Medication Sig Start Date End Date Taking? Authorizing Provider  buPROPion (WELLBUTRIN XL) 150 MG 24 hr tablet TAKE 1 TABLET BY MOUTH DAILY 08/22/14  Yes Deepak Advani,  MD  doxepin (SINEQUAN) 10 MG capsule Take 1 capsule (10 mg total) by mouth at bedtime. 08/15/14  Yes Doris Cheadle, MD  gemfibrozil (LOPID) 600 MG tablet Take 1 tablet (600 mg total) by mouth 2 (two) times daily before a meal. 03/19/14  Yes Deepak Advani, MD  glimepiride (AMARYL) 4 MG tablet TAKE 1 TABLET BY MOUTH DAILY WITH BREAKFAST. 08/01/14  Yes Deepak Advani, MD  glucose blood (CHOICE DM FORA G20 TEST STRIPS) test strip Use as instructed 03/19/14  Yes Deepak Advani, MD  insulin aspart protamine- aspart (NOVOLOG MIX 70/30) (70-30) 100 UNIT/ML injection Inject 0.23 mLs (23 Units total) into the skin 2 (two) times daily with a meal. 11/17/14  Yes Deepak Advani, MD  omega-3 acid ethyl esters (LOVAZA) 1 G capsule Take 1 capsule (1 g total) by mouth 2 (two) times daily. 11/17/14  Yes Doris Cheadle, MD  ramipril (ALTACE) 2.5 MG capsule Take 1 capsule (2.5 mg total) by mouth daily. 12/19/13  Yes Doris Cheadle, MD  sodium chloride (OCEAN) 0.65 % SOLN nasal spray Place 1 spray into both nostrils as needed for congestion. 08/15/14  Yes Doris Cheadle, MD  Vitamin D, Ergocalciferol, (DRISDOL) 50000 UNITS CAPS capsule Take 1 capsule (50,000 Units total) by mouth every 7 (seven) days. 01/15/14  Yes Doris Cheadle, MD  Vitamin D, Ergocalciferol, (DRISDOL) 50000 UNITS CAPS capsule Take 1 capsule (50,000 Units total) by mouth every  7 (seven) days. 07/04/14  Yes Deepak Advani, MD  azithromycin (ZITHROMAX Z-PAK) 250 MG tablet Take 2 pills today, then 1 pill daily until gone. 01/20/15   Charm Rings, MD  benzonatate (TESSALON) 100 MG capsule Take 1 capsule (100 mg total) by mouth 3 (three) times daily as needed for cough. 01/20/15   Charm Rings, MD  predniSONE (DELTASONE) 50 MG tablet Take 1 pill daily for 5 days. 01/20/15   Charm Rings, MD   Meds Ordered and Administered this Visit  Medications - No data to display  BP 130/74 mmHg  Pulse 66  Temp(Src) 98.4 F (36.9 C) (Oral)  Resp 16  SpO2 100% No data  found.   Physical Exam  Constitutional: She is oriented to person, place, and time. She appears well-developed and well-nourished. No distress.  HENT:  Nose: Nose normal.  Mouth/Throat: Oropharynx is clear and moist. No oropharyngeal exudate.  TMs with bilateral clear effusions. Bilateral maxillary and frontal tenderness.  Eyes: Conjunctivae are normal.  Neck: Neck supple.  Cardiovascular: Normal rate, regular rhythm and normal heart sounds.   No murmur heard. Pulmonary/Chest: Effort normal and breath sounds normal. No respiratory distress. She has no wheezes. She has no rales.  Lymphadenopathy:    She has no cervical adenopathy.  Neurological: She is alert and oriented to person, place, and time.    ED Course  Procedures (including critical care time)  Labs Review Labs Reviewed - No data to display  Imaging Review No results found.     MDM   1. Bronchitis   2. Acute pansinusitis, recurrence not specified    Treat with prednisone and azithromycin. Tessalon for cough. Recommended Tylenol and ibuprofen for sinus headache. Discussed frequent use of nasal saline spray. Follow-up as needed.    Charm Rings, MD 01/20/15 1400

## 2015-01-20 NOTE — Discharge Instructions (Signed)
You have bronchitis and a sinus infection. Take prednisone and azithromycin as prescribed. Use Tessalon 3 times a day as needed for cough. You can use ibuprofen or Tylenol to help with the headache. Use nasal saline spray every one to 2 hours to wash out the sinuses. Follow-up with your primary care doctor if you're not getting better in the next 3-5 days.

## 2015-02-02 ENCOUNTER — Encounter (HOSPITAL_COMMUNITY): Payer: Self-pay | Admitting: Emergency Medicine

## 2015-02-02 ENCOUNTER — Emergency Department (HOSPITAL_COMMUNITY)
Admission: EM | Admit: 2015-02-02 | Discharge: 2015-02-02 | Disposition: A | Discharge status: Home / Self Care | Payer: Self-pay | Attending: Emergency Medicine | Admitting: Emergency Medicine

## 2015-02-02 DIAGNOSIS — M25511 Pain in right shoulder: Secondary | ICD-10-CM | POA: Insufficient documentation

## 2015-02-02 DIAGNOSIS — E119 Type 2 diabetes mellitus without complications: Secondary | ICD-10-CM | POA: Insufficient documentation

## 2015-02-02 DIAGNOSIS — I10 Essential (primary) hypertension: Secondary | ICD-10-CM | POA: Insufficient documentation

## 2015-02-02 DIAGNOSIS — Z720 Tobacco use: Secondary | ICD-10-CM | POA: Insufficient documentation

## 2015-02-02 DIAGNOSIS — M79641 Pain in right hand: Secondary | ICD-10-CM | POA: Insufficient documentation

## 2015-02-02 DIAGNOSIS — Z79899 Other long term (current) drug therapy: Secondary | ICD-10-CM | POA: Insufficient documentation

## 2015-02-02 MED ORDER — NAPROXEN 500 MG PO TABS: 500.0000 mg | ORAL_TABLET | Freq: Two times a day (BID) | ORAL | Status: DC

## 2015-02-02 NOTE — ED Provider Notes (Signed)
CSN: [id number]     Arrival date & time 02/02/15  1122 History  By signing my name below, I, [name], attest that this documentation has been prepared under the direction and in the presence of Santiago Glad, PA-C Electronically Signed: Charline Bills, ED Scribe 02/02/2015 at 12:16 PM.   Chief Complaint  Patient presents with  . Shoulder Pain  . Hand Pain   The history is provided by the patient. No language interpreter was used.   HPI Comments: Sara Vasquez is a 49 y.o. female, with a h/o HTN and DM, who presents to the Emergency Department complaining of right shoulder pain that radiates into right hand onset 2 days ago. Pain is exacerbated with movement and palpation. She reports associated right hand swelling yesterday that has improved and tingling in her right arm. She reports h/o similar pain from a small tear in her right rotator cuff. No new injuries. Pt has tried Aleve yesterday and elevation without significant relief. She is right hand dominant. She also reports family h/o rheumatoid arthritis. Pt does not have an orthopedist due to lack of insurance.  No fever, chills, weakness, CP, or SOB.  Past Medical History  Diagnosis Date  . Diabetes mellitus without complication (HCC)   . Hypertension    Past Surgical History  Procedure Laterality Date  . Abdominal hysterectomy    . Back surgery    . Cholecystectomy     Family History  Problem Relation Age of Onset  . Diabetes Mother   . Heart disease Mother   . Hypertension Father   . Heart disease Father   . Cancer Paternal Aunt     lung cancer  . Cancer Maternal Grandmother     breast cancer  . Diabetes Maternal Grandfather    Social History  Substance Use Topics  . Smoking status: Current Every Day Smoker -- 1.00 packs/day for 35 years    Types: Cigarettes    Start date: 03/27/2014  . Smokeless tobacco: None  . Alcohol Use: No   OB History    No data available     Review of Systems  Musculoskeletal:  Positive for joint swelling and arthralgias.  Skin: Negative for color change.  All other systems reviewed and are negative.  Allergies  Fentanyl; Metformin and related; and Phenergan  Home Medications   Prior to Admission medications   Medication Sig Start Date End Date Taking? Authorizing Provider  azithromycin (ZITHROMAX Z-PAK) 250 MG tablet Take 2 pills today, then 1 pill daily until gone. 01/20/15   Charm Rings, MD  benzonatate (TESSALON) 100 MG capsule Take 1 capsule (100 mg total) by mouth 3 (three) times daily as needed for cough. 01/20/15   Charm Rings, MD  buPROPion (WELLBUTRIN XL) 150 MG 24 hr tablet TAKE 1 TABLET BY MOUTH DAILY 08/22/14   Doris Cheadle, MD  doxepin (SINEQUAN) 10 MG capsule Take 1 capsule (10 mg total) by mouth at bedtime. 08/15/14   Doris Cheadle, MD  gemfibrozil (LOPID) 600 MG tablet Take 1 tablet (600 mg total) by mouth 2 (two) times daily before a meal. 03/19/14   Deepak Advani, MD  glimepiride (AMARYL) 4 MG tablet TAKE 1 TABLET BY MOUTH DAILY WITH BREAKFAST. 08/01/14   Doris Cheadle, MD  glucose blood (CHOICE DM FORA G20 TEST STRIPS) test strip Use as instructed 03/19/14   Doris Cheadle, MD  insulin aspart protamine- aspart (NOVOLOG MIX 70/30) (70-30) 100 UNIT/ML injection Inject 0.23 mLs (23 Units total) into the  skin 2 (two) times daily with a meal. 11/17/14   Doris Cheadle, MD  omega-3 acid ethyl esters (LOVAZA) 1 G capsule Take 1 capsule (1 g total) by mouth 2 (two) times daily. 11/17/14   Doris Cheadle, MD  predniSONE (DELTASONE) 50 MG tablet Take 1 pill daily for 5 days. 01/20/15   Charm Rings, MD  ramipril (ALTACE) 2.5 MG capsule Take 1 capsule (2.5 mg total) by mouth daily. 12/19/13   Doris Cheadle, MD  sodium chloride (OCEAN) 0.65 % SOLN nasal spray Place 1 spray into both nostrils as needed for congestion. 08/15/14   Doris Cheadle, MD  Vitamin D, Ergocalciferol, (DRISDOL) 50000 UNITS CAPS capsule Take 1 capsule (50,000 Units total) by mouth every 7 (seven)  days. 01/15/14   Doris Cheadle, MD  Vitamin D, Ergocalciferol, (DRISDOL) 50000 UNITS CAPS capsule Take 1 capsule (50,000 Units total) by mouth every 7 (seven) days. 07/04/14   Deepak Advani, MD   BP 139/59 mmHg  Pulse 68  Temp(Src) 98.2 F (36.8 C) (Oral)  Resp 18  SpO2 99% Physical Exam  Constitutional: She is oriented to person, place, and time. She appears well-developed and well-nourished. No distress.  HENT:  Head: Normocephalic and atraumatic.  Eyes: Conjunctivae and EOM are normal.  Neck: Normal range of motion. Neck supple. No tracheal deviation present.  Cardiovascular: Normal rate, regular rhythm and normal heart sounds.   Pulses:      Radial pulses are 2+ on the right side.  Pulmonary/Chest: Effort normal and breath sounds normal. No respiratory distress.  Musculoskeletal: Normal range of motion.  Tenderness to palpation of R scapula. Tenderness to palpation of the R shoulder. No erythema, edema or warmth of the R shoulder. Pain with ROM, ABduction and flexion. Mild edema of the dorsal aspect of the R hand. No redness or warmth of the R hand.   Neurological: She is alert and oriented to person, place, and time. No sensory deficit.  Distal sensation of the R hand intact.  Grip strength 5/5 bilaterally  Skin: Skin is warm and dry.  Psychiatric: She has a normal mood and affect. Her behavior is normal.  Nursing note and vitals reviewed.  ED Course  Procedures (including critical care time) DIAGNOSTIC STUDIES: Oxygen Saturation is 99% on RA, normal by my interpretation.    COORDINATION OF CARE: 12:15 PM-Discussed treatment plan which includes sling, ice and follow-up with PCP or ortho with pt at bedside and pt agreed to plan.   Labs Review Labs Reviewed - No data to display  Imaging Review No results found. I have personally reviewed and evaluated these images and lab results as part of my medical decision-making.   EKG Interpretation None      MDM   Final  diagnoses:  None  Patient presents today with right shoulder pain.  She reports a history of a small tear of the rotator cuff.  No new injury or trauma.  No signs of infection.  Patient given sling for comfort, Naproxen, and referral to Orthopedics.  Stable for discharge.  Return precautions given.  I personally performed the services described in this documentation, which was scribed in my presence. The recorded information has been reviewed and is accurate.    Santiago Glad, PA-C 02/02/15 1427  Pricilla Loveless, MD 02/03/15 [phone]

## 2015-02-02 NOTE — ED Notes (Signed)
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   Patient states has had same for 3 years.  Patient states no new injury.   Patient also complains of R hand swelling.

## 2015-02-02 NOTE — ED Notes (Signed)
Pt sts right shoulder and hand pain x 2 days; pt sts hx of same in past

## 2015-02-04 ENCOUNTER — Other Ambulatory Visit: Payer: Self-pay | Admitting: Internal Medicine

## 2015-02-04 ENCOUNTER — Ambulatory Visit: Payer: No Typology Code available for payment source | Attending: Family Medicine | Admitting: Family Medicine

## 2015-02-04 ENCOUNTER — Encounter: Payer: Self-pay | Admitting: Family Medicine

## 2015-02-04 VITALS — BP 129/85 | HR 60 | Temp 98.3°F | Resp 16 | Ht 62.5 in | Wt 190.0 lb

## 2015-02-04 DIAGNOSIS — Z114 Encounter for screening for human immunodeficiency virus [HIV]: Secondary | ICD-10-CM | POA: Diagnosis not present

## 2015-02-04 DIAGNOSIS — M67911 Unspecified disorder of synovium and tendon, right shoulder: Secondary | ICD-10-CM | POA: Insufficient documentation

## 2015-02-04 DIAGNOSIS — E781 Pure hyperglyceridemia: Secondary | ICD-10-CM | POA: Diagnosis not present

## 2015-02-04 DIAGNOSIS — E785 Hyperlipidemia, unspecified: Secondary | ICD-10-CM

## 2015-02-04 DIAGNOSIS — E1165 Type 2 diabetes mellitus with hyperglycemia: Secondary | ICD-10-CM | POA: Diagnosis present

## 2015-02-04 DIAGNOSIS — E559 Vitamin D deficiency, unspecified: Secondary | ICD-10-CM | POA: Diagnosis not present

## 2015-02-04 DIAGNOSIS — E1169 Type 2 diabetes mellitus with other specified complication: Secondary | ICD-10-CM

## 2015-02-04 DIAGNOSIS — IMO0001 Reserved for inherently not codable concepts without codable children: Secondary | ICD-10-CM

## 2015-02-04 LAB — LIPID PANEL
CHOL/HDL RATIO: 7.9 ratio — AB (ref <=5.0)
CHOLESTEROL: 277 mg/dL — AB (ref 125–200)
HDL: 35 mg/dL — AB (ref >=46)
TRIGLYCERIDES: 427 mg/dL — AB (ref <150)

## 2015-02-04 LAB — GLUCOSE, POCT (MANUAL RESULT ENTRY): POC Glucose: 182 mg/dl — AB (ref 70–99)

## 2015-02-04 LAB — POCT GLYCOSYLATED HEMOGLOBIN (HGB A1C): Hemoglobin A1C: 9.1

## 2015-02-04 LAB — HIV ANTIBODY (ROUTINE TESTING W REFLEX): HIV 1&2 Ab, 4th Generation: NONREACTIVE

## 2015-02-04 MED ORDER — GLIMEPIRIDE 4 MG PO TABS: 8.0000 mg | ORAL_TABLET | Freq: Every day | ORAL | Status: DC

## 2015-02-04 MED ORDER — SITAGLIPTIN PHOSPHATE 100 MG PO TABS
100.0000 mg | ORAL_TABLET | Freq: Every day | ORAL | Status: DC
Start: 2015-02-04 — End: 2015-06-29

## 2015-02-04 MED ORDER — INSULIN ASPART PROT & ASPART (70-30 MIX) 100 UNIT/ML ~~LOC~~ SUSP: 23.0000 [IU] | Freq: Two times a day (BID) | SUBCUTANEOUS | Status: DC

## 2015-02-04 MED ORDER — GEMFIBROZIL 600 MG PO TABS: 600.0000 mg | ORAL_TABLET | Freq: Two times a day (BID) | ORAL | Status: DC

## 2015-02-04 MED ORDER — OMEGA-3-ACID ETHYL ESTERS 1 G PO CAPS: 1.0000 g | ORAL_CAPSULE | Freq: Two times a day (BID) | ORAL | Status: DC

## 2015-02-04 MED ORDER — TRAMADOL HCL 50 MG PO TABS: 50.0000 mg | ORAL_TABLET | Freq: Three times a day (TID) | ORAL | Status: DC | PRN

## 2015-02-04 NOTE — Progress Notes (Signed)
Patient ID: Sara Vasquez, female   DOB: 09/17/1965, 49 y.o.   MRN: [id number]   Subjective:  Patient ID: Sara Vasquez, female    DOB: 06/03/1965  Age: 49 y.o. MRN: [id number]  CC: Follow-up and Diabetes   Sara Vasquez presents for   1. CHRONIC DIABETES  Disease Monitoring  Blood Sugar Ranges: 150 fasting at home   Polyuria: no   Visual problems: no   Medication Compliance: yes  Medication Side Effects  Hypoglycemia: no   Preventitive Health Care  Eye Exam: due   Foot Exam: done today   Diet pattern: low carb   Exercise: minimal    2. R rotator cuff injury: small tear in rotator cuff due to on the job injury 3 years ago. No surgery at that time. Has worsening pain and swelling in R hand a few days ago. Went to ED. Now wearing a sling. Taking naproxen for pain. Naproxen helps. Makes her sleepy.   Social History  Substance Use Topics  . Smoking status: Current Every Day Smoker -- 1.00 packs/day for 35 years    Types: Cigarettes    Start date: 03/27/2014  . Smokeless tobacco: Not on file  . Alcohol Use: No   Outpatient Prescriptions Prior to Visit  Medication Sig Dispense Refill  . glimepiride (AMARYL) 4 MG tablet TAKE 1 TABLET BY MOUTH DAILY WITH BREAKFAST. 30 tablet 3  . insulin aspart protamine- aspart (NOVOLOG MIX 70/30) (70-30) 100 UNIT/ML injection Inject 0.23 mLs (23 Units total) into the skin 2 (two) times daily with a meal. 1 vial 3  . naproxen (NAPROSYN) 500 MG tablet Take 1 tablet (500 mg total) by mouth 2 (two) times daily. 30 tablet 0  . omega-3 acid ethyl esters (LOVAZA) 1 G capsule Take 1 capsule (1 g total) by mouth 2 (two) times daily. 180 capsule 3  . ramipril (ALTACE) 2.5 MG capsule Take 1 capsule (2.5 mg total) by mouth daily. 90 capsule 3  . sodium chloride (OCEAN) 0.65 % SOLN nasal spray Place 1 spray into both nostrils as needed for congestion. 15 mL 3  . azithromycin (ZITHROMAX Z-PAK) 250 MG tablet Take 2 pills today, then 1 pill daily until  gone. (Patient not taking: Reported on 02/04/2015) 6 tablet 0  . benzonatate (TESSALON) 100 MG capsule Take 1 capsule (100 mg total) by mouth 3 (three) times daily as needed for cough. (Patient not taking: Reported on 02/04/2015) 30 capsule 0  . buPROPion (WELLBUTRIN XL) 150 MG 24 hr tablet TAKE 1 TABLET BY MOUTH DAILY (Patient not taking: Reported on 02/04/2015) 30 tablet 2  . doxepin (SINEQUAN) 10 MG capsule Take 1 capsule (10 mg total) by mouth at bedtime. (Patient not taking: Reported on 02/04/2015) 30 capsule 3  . gemfibrozil (LOPID) 600 MG tablet Take 1 tablet (600 mg total) by mouth 2 (two) times daily before a meal. (Patient not taking: Reported on 02/04/2015) 90 tablet 3  . glucose blood (CHOICE DM FORA G20 TEST STRIPS) test strip Use as instructed (Patient not taking: Reported on 02/04/2015) 100 each 12  . predniSONE (DELTASONE) 50 MG tablet Take 1 pill daily for 5 days. (Patient not taking: Reported on 02/04/2015) 5 tablet 0  . Vitamin D, Ergocalciferol, (DRISDOL) 50000 UNITS CAPS capsule Take 1 capsule (50,000 Units total) by mouth every 7 (seven) days. (Patient not taking: Reported on 02/04/2015) 12 capsule 0  . Vitamin D, Ergocalciferol, (DRISDOL) 50000 UNITS CAPS capsule Take 1 capsule (50,000 Units total) by  mouth every 7 (seven) days. (Patient not taking: Reported on 02/04/2015) 12 capsule 0   No facility-administered medications prior to visit.    ROS Review of Systems  Constitutional: Negative for fever and chills.  Eyes: Negative for visual disturbance.  Respiratory: Negative for shortness of breath.   Cardiovascular: Negative for chest pain.  Gastrointestinal: Negative for abdominal pain and blood in stool.  Musculoskeletal: Positive for arthralgias (R shoulder ). Negative for back pain.  Skin: Negative for rash.  Allergic/Immunologic: Negative for immunocompromised state.  Hematological: Negative for adenopathy. Does not bruise/bleed easily.  Psychiatric/Behavioral:  Negative for suicidal ideas and dysphoric mood.    Objective:  BP 129/85 mmHg  Pulse 60  Temp(Src) 98.3 F (36.8 C) (Oral)  Resp 16  Ht 5' 2.5" (1.588 m)  Wt 190 lb (86.183 kg)  BMI 34.18 kg/m2  SpO2 98%  BP/Weight 02/04/2015 02/02/2015 01/20/2015  Systolic BP 129 133 130  Diastolic BP 85 63 74  Wt. (Lbs) 190 - -  BMI 34.18 - -    Physical Exam  Constitutional: She is oriented to person, place, and time. She appears well-developed and well-nourished. No distress.  HENT:  Head: Normocephalic and atraumatic.  Cardiovascular: Normal rate, regular rhythm, normal heart sounds and intact distal pulses.   Pulmonary/Chest: Effort normal and breath sounds normal.  Musculoskeletal: She exhibits no edema.  Neurological: She is alert and oriented to person, place, and time.  Skin: Skin is warm and dry. No rash noted.  Psychiatric: She has a normal mood and affect.    Lab Results  Component Value Date   HGBA1C 9.10 02/04/2015   CBG 182  Assessment & Plan:   Problem List Items Addressed This Visit    Diabetes type 2, uncontrolled (HCC) - Primary   Relevant Medications   insulin aspart protamine- aspart (NOVOLOG MIX 70/30) (70-30) 100 UNIT/ML injection   sitaGLIPtin (JANUVIA) 100 MG tablet   glimepiride (AMARYL) 4 MG tablet   Other Relevant Orders   POCT glucose (manual entry) (Completed)   POCT glycosylated hemoglobin (Hb A1C) (Completed)   Microalbumin / creatinine urine ratio (Completed)   Hypertriglyceridemia   Relevant Medications   omega-3 acid ethyl esters (LOVAZA) 1 G capsule   gemfibrozil (LOPID) 600 MG tablet   Other Relevant Orders   Lipid Panel (Completed)   Tendinopathy of right rotator cuff   Relevant Medications   traMADol (ULTRAM) 50 MG tablet   Vitamin D deficiency   Relevant Orders   Vitamin D, 25-hydroxy (Completed)    Other Visit Diagnoses    Screening for HIV (human immunodeficiency virus)        Relevant Orders    HIV antibody (with reflex)  (Completed)       No orders of the defined types were placed in this encounter.    Follow-up: No Follow-up on file.   Dessa Phi MD

## 2015-02-04 NOTE — Patient Instructions (Addendum)
Toniann FailWendy was seen today for follow-up and diabetes.  Diagnoses and all orders for this visit:  Uncontrolled type 2 diabetes mellitus without complication, without long-term current use of insulin (HCC) -     POCT glucose (manual entry) -     POCT glycosylated hemoglobin (Hb A1C) -     Microalbumin / creatinine urine ratio -     insulin aspart protamine- aspart (NOVOLOG MIX 70/30) (70-30) 100 UNIT/ML injection; Inject 0.23 mLs (23 Units total) into the skin 2 (two) times daily with a meal. -     sitaGLIPtin (JANUVIA) 100 MG tablet; Take 1 tablet (100 mg total) by mouth daily. -     glimepiride (AMARYL) 4 MG tablet; Take 2 tablets (8 mg total) by mouth daily before breakfast.  Tendinopathy of right rotator cuff -     traMADol (ULTRAM) 50 MG tablet; Take 1 tablet (50 mg total) by mouth every 8 (eight) hours as needed.  Hypertriglyceridemia -     Lipid Panel -     omega-3 acid ethyl esters (LOVAZA) 1 G capsule; Take 1 capsule (1 g total) by mouth 2 (two) times daily. -     gemfibrozil (LOPID) 600 MG tablet; Take 1 tablet (600 mg total) by mouth 2 (two) times daily before a meal.  Vitamin D deficiency -     Vitamin D, 25-hydroxy   Diabetes blood sugar goals  Fasting (in AM before breakfast, 8 hrs of no eating or drinking (except water or unsweetened coffee or tea): 90-110 2 hrs after meals: < 160,   No low sugars: nothing < 70   Schedule flu shot in 2-3 weeks  F/u in 3 months for diabetes  Dr. Armen PickupFunches

## 2015-02-04 NOTE — Progress Notes (Signed)
F/U DM, Pt fasting today  Taking Novalog 70/30 30 unit daily  Glucose been running around 150 fasting at home Taking medication as prescribed  Requesting refills on medication  Hx tobacco 1ppday

## 2015-02-05 ENCOUNTER — Telehealth: Payer: Self-pay | Admitting: Family Medicine

## 2015-02-05 LAB — VITAMIN D 25 HYDROXY (VIT D DEFICIENCY, FRACTURES): VIT D 25 HYDROXY: 15 ng/mL — AB (ref 30–100)

## 2015-02-05 LAB — MICROALBUMIN / CREATININE URINE RATIO
Creatinine, Urine: 110.2 mg/dL
MICROALB/CREAT RATIO: 7.3 mg/g (ref 0.0–30.0)
Microalb, Ur: 0.8 mg/dL (ref <2.0)

## 2015-02-05 NOTE — Telephone Encounter (Signed)
Patient called requesting a medication refill for   ramipril (ALTACE)     Please follow up

## 2015-02-09 MED ORDER — VITAMIN D (ERGOCALCIFEROL) 1.25 MG (50000 UNIT) PO CAPS: 50000.0000 [IU] | ORAL_CAPSULE | ORAL | Status: DC

## 2015-02-09 MED ORDER — ATORVASTATIN CALCIUM 40 MG PO TABS: 40.0000 mg | ORAL_TABLET | Freq: Every day | ORAL | Status: DC

## 2015-02-09 NOTE — Addendum Note (Signed)
Addended by: Dessa PhiFUNCHES, Solace Wendorff on: 02/09/2015 09:27 AM   Modules accepted: Orders, Medications

## 2015-02-11 ENCOUNTER — Emergency Department
Admission: EM | Admit: 2015-02-11 | Discharge: 2015-02-11 | Disposition: A | Discharge status: Home / Self Care | Payer: No Typology Code available for payment source | Attending: Emergency Medicine | Admitting: Emergency Medicine

## 2015-02-11 ENCOUNTER — Emergency Department: Payer: No Typology Code available for payment source

## 2015-02-11 ENCOUNTER — Encounter: Payer: Self-pay | Admitting: *Deleted

## 2015-02-11 DIAGNOSIS — Z791 Long term (current) use of non-steroidal anti-inflammatories (NSAID): Secondary | ICD-10-CM | POA: Diagnosis not present

## 2015-02-11 DIAGNOSIS — S199XXA Unspecified injury of neck, initial encounter: Secondary | ICD-10-CM | POA: Diagnosis present

## 2015-02-11 DIAGNOSIS — Y9389 Activity, other specified: Secondary | ICD-10-CM | POA: Diagnosis not present

## 2015-02-11 DIAGNOSIS — Z79899 Other long term (current) drug therapy: Secondary | ICD-10-CM | POA: Insufficient documentation

## 2015-02-11 DIAGNOSIS — Y998 Other external cause status: Secondary | ICD-10-CM | POA: Diagnosis not present

## 2015-02-11 DIAGNOSIS — S39012A Strain of muscle, fascia and tendon of lower back, initial encounter: Secondary | ICD-10-CM | POA: Diagnosis not present

## 2015-02-11 DIAGNOSIS — E785 Hyperlipidemia, unspecified: Secondary | ICD-10-CM | POA: Insufficient documentation

## 2015-02-11 DIAGNOSIS — E119 Type 2 diabetes mellitus without complications: Secondary | ICD-10-CM | POA: Diagnosis not present

## 2015-02-11 DIAGNOSIS — I10 Essential (primary) hypertension: Secondary | ICD-10-CM | POA: Insufficient documentation

## 2015-02-11 DIAGNOSIS — Z720 Tobacco use: Secondary | ICD-10-CM | POA: Diagnosis not present

## 2015-02-11 DIAGNOSIS — S161XXA Strain of muscle, fascia and tendon at neck level, initial encounter: Secondary | ICD-10-CM | POA: Diagnosis not present

## 2015-02-11 DIAGNOSIS — Z794 Long term (current) use of insulin: Secondary | ICD-10-CM | POA: Diagnosis not present

## 2015-02-11 DIAGNOSIS — Y92410 Unspecified street and highway as the place of occurrence of the external cause: Secondary | ICD-10-CM | POA: Diagnosis not present

## 2015-02-11 DIAGNOSIS — S4991XA Unspecified injury of right shoulder and upper arm, initial encounter: Secondary | ICD-10-CM | POA: Insufficient documentation

## 2015-02-11 DIAGNOSIS — M25511 Pain in right shoulder: Secondary | ICD-10-CM

## 2015-02-11 MED ORDER — KETOROLAC TROMETHAMINE 60 MG/2ML IM SOLN
60.0000 mg | Freq: Once | INTRAMUSCULAR | Status: AC
  Administered 2015-02-11: 60 mg via INTRAMUSCULAR
  Filled 2015-02-11: qty 2

## 2015-02-11 MED ORDER — TRAMADOL HCL 50 MG PO TABS
50.0000 mg | ORAL_TABLET | Freq: Once | ORAL | Status: AC
  Administered 2015-02-11: 50 mg via ORAL
  Filled 2015-02-11: qty 1

## 2015-02-11 MED ORDER — KETOROLAC TROMETHAMINE 10 MG PO TABS: 10.0000 mg | ORAL_TABLET | Freq: Four times a day (QID) | ORAL | Status: DC | PRN

## 2015-02-11 MED ORDER — ORPHENADRINE CITRATE 30 MG/ML IJ SOLN
60.0000 mg | Freq: Two times a day (BID) | INTRAMUSCULAR | Status: DC
  Administered 2015-02-11: 60 mg via INTRAMUSCULAR
  Filled 2015-02-11: qty 2

## 2015-02-11 MED ORDER — METHOCARBAMOL 750 MG PO TABS: 1500.0000 mg | ORAL_TABLET | Freq: Four times a day (QID) | ORAL | Status: DC

## 2015-02-11 MED ORDER — TRAMADOL HCL 50 MG PO TABS: 50.0000 mg | ORAL_TABLET | Freq: Four times a day (QID) | ORAL | Status: DC | PRN
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
## 2015-02-16 NOTE — Telephone Encounter (Signed)
Left [facility]  Call pharmacy for refills

## 2015-03-02 NOTE — Telephone Encounter (Signed)
-----   Message from Dessa PhiJosalyn Funches, MD sent at 02/09/2015  9:27 AM EDT ----- Vit D def, vit D x8 weeks ordered Screening HIV negative Elevated cholesterol in setting of diabetes, lipitor 40 mg daily ordered . STOP lopid.  Normal urine microalbumin

## 2015-03-02 NOTE — Telephone Encounter (Signed)
-----   Message from Dessa PhiJosalyn Funches, MD sent at 02/09/2015  9:22 AM EDT ----- Vit D def, vit D x8 weeks ordered Screening HIV negative Elevated cholesterol in setting of diabetes, lipitor 40 mg daily ordered   Normal urine microalbumin

## 2015-03-02 NOTE — Telephone Encounter (Signed)
Left message with female to return call 

## 2015-04-02 ENCOUNTER — Encounter: Payer: Self-pay | Admitting: Family Medicine

## 2015-04-02 ENCOUNTER — Ambulatory Visit: Payer: No Typology Code available for payment source | Attending: Family Medicine | Admitting: Family Medicine

## 2015-04-02 VITALS — BP 128/75 | HR 65 | Temp 98.2°F | Resp 16 | Ht 62.5 in | Wt 187.0 lb

## 2015-04-02 DIAGNOSIS — T1490 Injury, unspecified: Secondary | ICD-10-CM | POA: Insufficient documentation

## 2015-04-02 DIAGNOSIS — M25551 Pain in right hip: Secondary | ICD-10-CM | POA: Diagnosis not present

## 2015-04-02 DIAGNOSIS — F17210 Nicotine dependence, cigarettes, uncomplicated: Secondary | ICD-10-CM | POA: Insufficient documentation

## 2015-04-02 DIAGNOSIS — F419 Anxiety disorder, unspecified: Secondary | ICD-10-CM | POA: Insufficient documentation

## 2015-04-02 DIAGNOSIS — IMO0001 Reserved for inherently not codable concepts without codable children: Secondary | ICD-10-CM

## 2015-04-02 DIAGNOSIS — E1165 Type 2 diabetes mellitus with hyperglycemia: Secondary | ICD-10-CM | POA: Diagnosis present

## 2015-04-02 DIAGNOSIS — T148XXA Other injury of unspecified body region, initial encounter: Secondary | ICD-10-CM | POA: Insufficient documentation

## 2015-04-02 DIAGNOSIS — F4311 Post-traumatic stress disorder, acute: Secondary | ICD-10-CM | POA: Diagnosis not present

## 2015-04-02 DIAGNOSIS — Z0000 Encounter for general adult medical examination without abnormal findings: Secondary | ICD-10-CM | POA: Diagnosis not present

## 2015-04-02 DIAGNOSIS — Z794 Long term (current) use of insulin: Secondary | ICD-10-CM | POA: Diagnosis not present

## 2015-04-02 DIAGNOSIS — F329 Major depressive disorder, single episode, unspecified: Secondary | ICD-10-CM | POA: Insufficient documentation

## 2015-04-02 DIAGNOSIS — F32A Depression, unspecified: Secondary | ICD-10-CM

## 2015-04-02 LAB — GLUCOSE, POCT (MANUAL RESULT ENTRY): POC GLUCOSE: 160 mg/dL — AB (ref 70–99)

## 2015-04-02 MED ORDER — HYDROXYZINE HCL 10 MG PO TABS: 10.0000 mg | ORAL_TABLET | Freq: Three times a day (TID) | ORAL | Status: DC | PRN

## 2015-04-02 MED ORDER — METHOCARBAMOL 750 MG PO TABS: 750.0000 mg | ORAL_TABLET | Freq: Three times a day (TID) | ORAL | Status: DC | PRN

## 2015-04-02 MED ORDER — TRAMADOL HCL 50 MG PO TABS: 50.0000 mg | ORAL_TABLET | Freq: Three times a day (TID) | ORAL | Status: DC | PRN

## 2015-04-02 MED ORDER — BUPROPION HCL ER (XL) 150 MG PO TB24: 150.0000 mg | ORAL_TABLET | Freq: Every day | ORAL | Status: DC

## 2015-04-02 MED ORDER — AMITRIPTYLINE HCL 25 MG PO TABS: 25.0000 mg | ORAL_TABLET | Freq: Every day | ORAL | Status: DC
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
## 2015-04-02 NOTE — Assessment & Plan Note (Signed)
A; acute stress on top of chronic depression P: Continue Wellbutrin 150 mg XL daily  Add nightly elavil to help with sleep Add prn atarax On site counseling services available, patient declined today due to being short of time F/u in 4 weeks

## 2015-04-02 NOTE — Progress Notes (Signed)
F/U anxiety  Lowe back pain, going to PT  Pain scale #5 Tobacco user 2 ppday No suicidal thought in the past two weeks

## 2015-04-02 NOTE — Patient Instructions (Addendum)
Sara Vasquez was seen today for anxiety.  Diagnoses and all orders for this visit:  Uncontrolled type 2 diabetes mellitus without complication, without long-term current use of insulin (HCC) -     POCT glucose (manual entry)  Traumatic myalgia -     methocarbamol (ROBAXIN-750) 750 MG tablet; Take 1 tablet (750 mg total) by mouth every 8 (eight) hours as needed for muscle spasms. -     traMADol (ULTRAM) 50 MG tablet; Take 1 tablet (50 mg total) by mouth every 8 (eight) hours as needed.  Acute posttraumatic stress disorder -     hydrOXYzine (ATARAX/VISTARIL) 10 MG tablet; Take 1 tablet (10 mg total) by mouth 3 (three) times daily as needed.  Depression -     amitriptyline (ELAVIL) 25 MG tablet; Take 1 tablet (25 mg total) by mouth at bedtime. -     buPROPion (WELLBUTRIN XL) 150 MG 24 hr tablet; Take 1 tablet (150 mg total) by mouth daily.  Healthcare maintenance -     Flu Vaccine QUAD 36+ mos IM   F/u in 4 weeks for depression and and acute stress  Counseling services available with Sara Vasquez, and at [facility], [facility] and [facility].    Dr. Armen PickupFunches

## 2015-06-18 ENCOUNTER — Other Ambulatory Visit: Payer: Self-pay | Admitting: Family Medicine

## 2015-06-18 DIAGNOSIS — E782 Mixed hyperlipidemia: Secondary | ICD-10-CM

## 2015-06-18 MED FILL — AMITRIPTYLINE HCL 25 MG TAB: 25 | 30 days supply | Qty: 30 | Fill #1

## 2015-06-18 MED FILL — !LOVAZA 1 GM CAPSULE: 1 | 30 days supply | Qty: 60 | Fill #2

## 2015-06-18 MED FILL — ATORVASTATIN 40 MG TABLET: 40 | 30 days supply | Qty: 30 | Fill #1

## 2015-06-18 MED FILL — hydrOXYzine HCL 10 MG TABS: 10 | 10 days supply | Qty: 30 | Fill #1

## 2015-06-18 MED FILL — RAMIPRIL 2.5 MG CAPSULE: 2.5 | 30 days supply | Qty: 30 | Fill #0

## 2015-06-18 MED FILL — ?BUPROPION HCL XL 150 MG TA: 150 | 30 days supply | Qty: 30 | Fill #1

## 2015-06-18 MED FILL — JANUVIA 100 MG TABLET: 100 | 30 days supply | Qty: 30 | Fill #1

## 2015-06-19 DIAGNOSIS — E782 Mixed hyperlipidemia: Secondary | ICD-10-CM | POA: Insufficient documentation

## 2015-06-19 MED FILL — !NOVOLOG MIX 70/30 VIAL: 70-30/ML | 28 days supply | Qty: 10 | Fill #1

## 2015-06-19 MED FILL — GLIMEPIRIDE 4 MG TABLET: 4 | 30 days supply | Qty: 60 | Fill #1

## 2015-06-26 ENCOUNTER — Telehealth: Payer: Self-pay | Admitting: Family Medicine

## 2015-06-26 NOTE — Telephone Encounter (Signed)
Pain radiates from lower back to shoulders, it has been going on for two days...patient started new medication (Januvia) on the 23rd of Feb. ....Marland Kitchen.please follow up with patient

## 2015-06-29 ENCOUNTER — Other Ambulatory Visit: Payer: Self-pay | Admitting: *Deleted

## 2015-06-29 DIAGNOSIS — E1165 Type 2 diabetes mellitus with hyperglycemia: Secondary | ICD-10-CM

## 2015-06-29 DIAGNOSIS — IMO0001 Reserved for inherently not codable concepts without codable children: Secondary | ICD-10-CM

## 2015-06-29 DIAGNOSIS — E781 Pure hyperglyceridemia: Secondary | ICD-10-CM

## 2015-06-29 MED ORDER — OMEGA-3-ACID ETHYL ESTERS 1 G PO CAPS: 1.0000 g | ORAL_CAPSULE | Freq: Two times a day (BID) | ORAL | Status: DC

## 2015-06-29 MED ORDER — INSULIN ASPART PROT & ASPART (70-30 MIX) 100 UNIT/ML ~~LOC~~ SUSP: 23.0000 [IU] | Freq: Two times a day (BID) | SUBCUTANEOUS | Status: DC

## 2015-06-29 MED ORDER — SITAGLIPTIN PHOSPHATE 100 MG PO TABS: 100.0000 mg | ORAL_TABLET | Freq: Every day | ORAL | Status: DC

## 2015-06-29 NOTE — Telephone Encounter (Signed)
Patient called requesting to speak to nurse regarding medication reaction. Patient states since she started medication she feels pain radiating from lower back to shoulders. Please f/u

## 2015-07-02 NOTE — Telephone Encounter (Signed)
Please advised patient to stop medicine for one week to determine if it is the source Schedule f/u for diabetes She is due

## 2015-07-03 NOTE — Telephone Encounter (Signed)
LVM to return call   PLEASE SCHEDULE F/U DM APPOINTMENT WITH PCP

## 2015-07-09 ENCOUNTER — Ambulatory Visit: Payer: No Typology Code available for payment source | Attending: Family Medicine | Admitting: Family Medicine

## 2015-07-09 ENCOUNTER — Encounter: Payer: Self-pay | Admitting: Family Medicine

## 2015-07-09 VITALS — BP 129/77 | HR 55 | Temp 97.8°F | Resp 16 | Ht 62.5 in | Wt 190.0 lb

## 2015-07-09 DIAGNOSIS — Z794 Long term (current) use of insulin: Secondary | ICD-10-CM | POA: Insufficient documentation

## 2015-07-09 DIAGNOSIS — M545 Low back pain, unspecified: Secondary | ICD-10-CM

## 2015-07-09 DIAGNOSIS — IMO0001 Reserved for inherently not codable concepts without codable children: Secondary | ICD-10-CM

## 2015-07-09 DIAGNOSIS — E1165 Type 2 diabetes mellitus with hyperglycemia: Secondary | ICD-10-CM

## 2015-07-09 DIAGNOSIS — F32A Depression, unspecified: Secondary | ICD-10-CM

## 2015-07-09 DIAGNOSIS — E119 Type 2 diabetes mellitus without complications: Secondary | ICD-10-CM | POA: Insufficient documentation

## 2015-07-09 DIAGNOSIS — F17210 Nicotine dependence, cigarettes, uncomplicated: Secondary | ICD-10-CM | POA: Insufficient documentation

## 2015-07-09 DIAGNOSIS — M549 Dorsalgia, unspecified: Secondary | ICD-10-CM | POA: Insufficient documentation

## 2015-07-09 DIAGNOSIS — Z79899 Other long term (current) drug therapy: Secondary | ICD-10-CM | POA: Insufficient documentation

## 2015-07-09 DIAGNOSIS — G8929 Other chronic pain: Secondary | ICD-10-CM | POA: Insufficient documentation

## 2015-07-09 DIAGNOSIS — F329 Major depressive disorder, single episode, unspecified: Secondary | ICD-10-CM | POA: Insufficient documentation

## 2015-07-09 LAB — COMPLETE METABOLIC PANEL WITH GFR
ALBUMIN: 4.2 g/dL (ref 3.6–5.1)
ALK PHOS: 72 U/L (ref 33–115)
ALT: 47 U/L — AB (ref 6–29)
AST: 31 U/L (ref 10–35)
BILIRUBIN TOTAL: 0.3 mg/dL (ref 0.2–1.2)
BUN: 10 mg/dL (ref 7–25)
CALCIUM: 10.3 mg/dL — AB (ref 8.6–10.2)
CO2: 23 mmol/L (ref 20–31)
CREATININE: 0.84 mg/dL (ref 0.50–1.10)
Chloride: 99 mmol/L (ref 98–110)
GFR, EST NON AFRICAN AMERICAN: 82 mL/min (ref >=60)
Glucose, Bld: 156 mg/dL — ABNORMAL HIGH (ref 65–99)
Potassium: 4.2 mmol/L (ref 3.5–5.3)
Sodium: 139 mmol/L (ref 135–146)
Total Protein: 7.3 g/dL (ref 6.1–8.1)

## 2015-07-09 LAB — GLUCOSE, POCT (MANUAL RESULT ENTRY): POC Glucose: 163 mg/dl — AB (ref 70–99)

## 2015-07-09 MED ORDER — ACETAMINOPHEN-CODEINE #3 300-30 MG PO TABS: 1.0000 | ORAL_TABLET | Freq: Three times a day (TID) | ORAL | Status: DC | PRN

## 2015-07-09 MED ORDER — DULOXETINE HCL 30 MG PO CPEP: 30.0000 mg | ORAL_CAPSULE | Freq: Every day | ORAL | Status: DC

## 2015-07-09 MED ORDER — GLUCOSE BLOOD VI STRP: 1.0000 | ORAL_STRIP | Freq: Three times a day (TID) | Status: DC

## 2015-07-09 MED FILL — !NOVOLOG MIX 70/30 VIAL: 70-30/ML | 28 days supply | Qty: 10 | Fill #2

## 2015-07-09 MED FILL — TRUE METRIX TEST STRIP: 33 days supply | Qty: 100 | Fill #0

## 2015-07-09 MED FILL — DULoxetine HCL 30 MG CPEP: 30 | 30 days supply | Qty: 30 | Fill #0

## 2015-07-09 NOTE — Assessment & Plan Note (Signed)
A; persistent with MSK pain P: D.c elavil and wellbutrin Add cymbalta

## 2015-07-09 NOTE — Progress Notes (Signed)
F/U DM  Taking medication daily  Glucose been running 65-170 Pain scale #7 back pain  Tobacco user 1-2ppday  No suicidal thoughts in the past two weeks

## 2015-07-09 NOTE — Patient Instructions (Addendum)
Toniann FailWendy was seen today for diabetes.  Diagnoses and all orders for this visit:  Uncontrolled type 2 diabetes mellitus without complication, without long-term current use of insulin (HCC) -     HgB A1c -     Glucose (CBG) -     COMPLETE METABOLIC PANEL WITH GFR -     glucose blood (TRUE METRIX BLOOD GLUCOSE TEST) test strip; 1 each by Other route 3 (three) times daily. -     Ambulatory referral to Ophthalmology  Chronic low back pain -     acetaminophen-codeine (TYLENOL #3) 300-30 MG tablet; Take 1 tablet by mouth every 8 (eight) hours as needed for moderate pain.  Depression -     DULoxetine (CYMBALTA) 30 MG capsule; Take 1 capsule (30 mg total) by mouth daily.   F/u in 4 weeks for depressed mood and back pain  Dr. Armen PickupFunches

## 2015-07-09 NOTE — Assessment & Plan Note (Signed)
A; unchanged with A1c of 9.3 due to med non compliance  P: Refilled all meds and testing supplies

## 2015-07-09 NOTE — Assessment & Plan Note (Addendum)
A; chronic MSK pain P; NSAID BMP cymbalta  D.c tramadol Add tylenol #3
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
## 2015-07-10 ENCOUNTER — Encounter: Payer: Self-pay | Admitting: Clinical

## 2015-07-10 LAB — POCT GLYCOSYLATED HEMOGLOBIN (HGB A1C): HEMOGLOBIN A1C: 9.3

## 2015-07-10 MED FILL — ACETAMINOPHEN/COD #3 TABLET: 300-30 | 20 days supply | Qty: 60 | Fill #0

## 2015-07-10 NOTE — Progress Notes (Signed)
[facility] 07/09/2015 04/02/2015 11/17/2014 08/15/2014 12/19/2013  Decreased Interest 3 3 2 3  0  Down, Depressed, Hopeless 3 3 3  0 0  PHQ - 2 Score 6 6 5 3  0  Altered sleeping 3 3 3 3  -  Tired, decreased energy 3 3 3 3  -  Change in appetite 3 3 2  0 -  Feeling bad or failure about yourself  0 0 3 0 -  Trouble concentrating 1 1 3  0 -  Moving slowly or fidgety/restless 0 1 2 0 -  Suicidal thoughts 0 0 0 0 -  PHQ-9 Score 16 17 21 9  -    GAD 7 : Generalized Anxiety Score 07/09/2015 07/09/2015 04/02/2015  Nervous, Anxious, on Edge 3 1 3   Control/stop worrying 3 1 3   Worry too much - different things 3 1 3   Trouble relaxing 3 2 3   Restless 2 1 2   Easily annoyed or irritable 3 1 3   Afraid - awful might happen 3 0 3  Total GAD 7 Score 20 7 20

## 2015-07-10 NOTE — Telephone Encounter (Signed)
Pt. Came into facility to speak to nurse regarding the medication of DULoxetine (CYMBALTA) 30 MG capsule. Pt. Would like to know if she has to stop taking her sleeping medications. Please f/u with pt.

## 2015-07-13 NOTE — Telephone Encounter (Signed)
Is she referring to the atarax? Is that her sleeping medicine. She can take cymbalta and atarax.

## 2015-07-14 ENCOUNTER — Other Ambulatory Visit: Payer: Self-pay | Admitting: Pharmacist

## 2015-07-14 MED ORDER — TRUE METRIX METER W/DEVICE KIT: PACK | Status: DC

## 2015-07-14 MED FILL — !TRUE METRIX BLOOD GLUCOSE: 1 days supply | Qty: 1 | Fill #0

## 2015-07-15 ENCOUNTER — Telehealth: Payer: Self-pay | Admitting: *Deleted

## 2015-07-15 NOTE — Telephone Encounter (Signed)
Unable to contact pt. No answer.

## 2015-07-15 NOTE — Telephone Encounter (Signed)
-----   Message from Dessa PhiJosalyn Funches, MD sent at 07/10/2015  8:39 AM EDT ----- Elevated blood sugar on CMP  Slightly elevated but improved ALT Slight elevated calcium Otherwise normal Continue current care plan

## 2015-08-17 MED FILL — ATORVASTATIN 40 MG TABLET: 40 | 30 days supply | Qty: 30 | Fill #2

## 2015-08-17 MED FILL — !NOVOLOG MIX 70/30 VIAL: 70-30/ML | 28 days supply | Qty: 10 | Fill #3

## 2015-08-17 MED FILL — GLIMEPIRIDE 4 MG TABLET: 4 | 30 days supply | Qty: 60 | Fill #2

## 2015-08-17 MED FILL — !LOVAZA 1 GM CAPSULE: 1 | 30 days supply | Qty: 60 | Fill #0

## 2015-08-17 MED FILL — AMITRIPTYLINE HCL 25 MG TAB: 25 | 30 days supply | Qty: 30 | Fill #2

## 2015-08-17 MED FILL — hydrOXYzine HCL 10 MG TABS: 10 | 10 days supply | Qty: 30 | Fill #2

## 2015-08-17 MED FILL — ?BUPROPION HCL XL 150 MG TA: 150 | 30 days supply | Qty: 30 | Fill #2

## 2015-08-17 MED FILL — **JANUVIA 100 MG TABLET: 100 | 30 days supply | Qty: 30 | Fill #2

## 2015-08-17 MED FILL — RAMIPRIL 2.5 MG CAPSULE: 2.5 | 30 days supply | Qty: 30 | Fill #1

## 2015-08-17 MED FILL — DULoxetine HCL 30 MG CPEP: 30 | 30 days supply | Qty: 30 | Fill #1

## 2015-08-18 ENCOUNTER — Ambulatory Visit: Payer: No Typology Code available for payment source | Attending: Family Medicine | Admitting: Family Medicine

## 2015-08-18 ENCOUNTER — Encounter: Payer: Self-pay | Admitting: Clinical

## 2015-08-18 ENCOUNTER — Encounter: Payer: Self-pay | Admitting: Family Medicine

## 2015-08-18 VITALS — BP 126/85 | HR 74 | Temp 98.1°F | Resp 16 | Ht 62.5 in | Wt 190.0 lb

## 2015-08-18 DIAGNOSIS — IMO0001 Reserved for inherently not codable concepts without codable children: Secondary | ICD-10-CM

## 2015-08-18 DIAGNOSIS — F329 Major depressive disorder, single episode, unspecified: Secondary | ICD-10-CM | POA: Insufficient documentation

## 2015-08-18 DIAGNOSIS — Z79899 Other long term (current) drug therapy: Secondary | ICD-10-CM | POA: Insufficient documentation

## 2015-08-18 DIAGNOSIS — E119 Type 2 diabetes mellitus without complications: Secondary | ICD-10-CM | POA: Insufficient documentation

## 2015-08-18 DIAGNOSIS — M545 Low back pain: Secondary | ICD-10-CM | POA: Insufficient documentation

## 2015-08-18 DIAGNOSIS — F17210 Nicotine dependence, cigarettes, uncomplicated: Secondary | ICD-10-CM | POA: Insufficient documentation

## 2015-08-18 DIAGNOSIS — Z794 Long term (current) use of insulin: Secondary | ICD-10-CM | POA: Insufficient documentation

## 2015-08-18 DIAGNOSIS — G8929 Other chronic pain: Secondary | ICD-10-CM | POA: Insufficient documentation

## 2015-08-18 DIAGNOSIS — E1165 Type 2 diabetes mellitus with hyperglycemia: Secondary | ICD-10-CM | POA: Insufficient documentation

## 2015-08-18 DIAGNOSIS — F32A Depression, unspecified: Secondary | ICD-10-CM

## 2015-08-18 LAB — GLUCOSE, POCT (MANUAL RESULT ENTRY): POC Glucose: 201 mg/dl — AB (ref 70–99)

## 2015-08-18 MED ORDER — ACETAMINOPHEN-CODEINE #3 300-30 MG PO TABS: 1.0000 | ORAL_TABLET | Freq: Three times a day (TID) | ORAL | Status: DC | PRN

## 2015-08-18 MED ORDER — INSULIN ASPART PROT & ASPART (70-30 MIX) 100 UNIT/ML ~~LOC~~ SUSP: 32.0000 [IU] | Freq: Two times a day (BID) | SUBCUTANEOUS | Status: DC

## 2015-08-18 MED FILL — ACETAMINOPHEN/COD #3 TABLET: 300-30 | 20 days supply | Qty: 60 | Fill #0

## 2015-08-18 NOTE — Patient Instructions (Addendum)
Sara Vasquez was seen today for depression.  Diagnoses and all orders for this visit:  Uncontrolled type 2 diabetes mellitus without complication, without long-term current use of insulin (HCC) -     POCT glucose (manual entry) -     insulin aspart protamine- aspart (NOVOLOG MIX 70/30) (70-30) 100 UNIT/ML injection; Inject 0.32 mLs (32 Units total) into the skin 2 (two) times daily with a meal.  Chronic low back pain -     acetaminophen-codeine (TYLENOL #3) 300-30 MG tablet; Take 1 tablet by mouth every 8 (eight) hours as needed for moderate pain.  Depression  continue cymbalta 30 mg daily   F/u in 4 weeks for diabetes and depression   Dr. Armen PickupFunches

## 2015-08-18 NOTE — Assessment & Plan Note (Signed)
Persistent depression Advised continued baking Continue cymbalta No SI Plan to increase if still tolerating a f/u visit in 4 weeks

## 2015-08-18 NOTE — Progress Notes (Signed)
F/U depression  No changes since last visit  Pain scale # 6 back pain No suicidal thoughts in the past two weeks  Tobacco user 2ppday

## 2015-08-18 NOTE — Assessment & Plan Note (Signed)
Chronic pain Refilled tylenol #3

## 2015-08-18 NOTE — Progress Notes (Signed)
Subjective:  Patient ID: Sara Vasquez, female    DOB: 05/07/1965  Age: 50 y.o. MRN: [id number]  CC: Depression   HPI Sara Vasquez presents for    1. Depression: slight improvement with cymbalta. No SI. Enjoys baking. Stress with grandson and his mother in her house, step son in [employer], estranged from 2 biological children. Smoking and eating relieves stress. Not ready to quit smoking.   2. Diabetes: has cut out soda. Compliant with medication. Sometimes take increase dose of insulin up to 30 U. No low CBGs. Range is 160s-200s.   3. Chronic back pain: improved with tylenol #3. Needs a refill. 60 tabs last one month. No new injury.   Social History  Substance Use Topics  . Smoking status: Current Every Day Smoker -- 1.00 packs/day for 35 years    Types: Cigarettes    Start date: 03/27/2014  . Smokeless tobacco: Not on file  . Alcohol Use: No    Outpatient Prescriptions Prior to Visit  Medication Sig Dispense Refill  . acetaminophen-codeine (TYLENOL #3) 300-30 MG tablet Take 1 tablet by mouth every 8 (eight) hours as needed for moderate pain. 60 tablet 0  . atorvastatin (LIPITOR) 40 MG tablet Take 1 tablet (40 mg total) by mouth daily. 30 tablet 11  . Blood Glucose Monitoring Suppl (TRUE METRIX METER) w/Device KIT USE AS DIRECTED 1 kit 0  . DULoxetine (CYMBALTA) 30 MG capsule Take 1 capsule (30 mg total) by mouth daily. 30 capsule 5  . gemfibrozil (LOPID) 600 MG tablet TAKE 1 TABLET BY MOUTH 2 TIMES DAILY BEFORE A MEAL 60 tablet 5  . glimepiride (AMARYL) 4 MG tablet Take 2 tablets (8 mg total) by mouth daily before breakfast. 60 tablet 5  . glucose blood (TRUE METRIX BLOOD GLUCOSE TEST) test strip 1 each by Other route 3 (three) times daily. 100 each 11  . hydrOXYzine (ATARAX/VISTARIL) 10 MG tablet Take 1 tablet (10 mg total) by mouth 3 (three) times daily as needed. 30 tablet 2  . insulin aspart protamine- aspart (NOVOLOG MIX 70/30) (70-30) 100 UNIT/ML injection Inject 0.23 mLs  (23 Units total) into the skin 2 (two) times daily with a meal. 50 mL 3  . methocarbamol (ROBAXIN-750) 750 MG tablet Take 1 tablet (750 mg total) by mouth every 8 (eight) hours as needed for muscle spasms. 30 tablet 0  . naproxen (NAPROSYN) 500 MG tablet Take 1 tablet (500 mg total) by mouth 2 (two) times daily. 30 tablet 0  . omega-3 acid ethyl esters (LOVAZA) 1 g capsule Take 1 capsule (1 g total) by mouth 2 (two) times daily. 180 capsule 3  . ramipril (ALTACE) 2.5 MG capsule TAKE 1 CAPSULE BY MOUTH ONCE DAILY 90 capsule 3  . sitaGLIPtin (JANUVIA) 100 MG tablet Take 1 tablet (100 mg total) by mouth daily. 90 tablet 3  . sodium chloride (OCEAN) 0.65 % SOLN nasal spray Place 1 spray into both nostrils as needed for congestion. 15 mL 3   No facility-administered medications prior to visit.    ROS Review of Systems  Objective:  There were no vitals taken for this visit.  BP/Weight 07/09/2015 04/02/2015 70/96/2836  Systolic BP 629 476 546  Diastolic BP 77 75 73  Wt. (Lbs) 190 187 187.17  BMI 34.18 33.64 34.23   Physical Exam  Constitutional: She is oriented to person, place, and time. She appears well-developed and well-nourished. No distress.  HENT:  Head: Normocephalic and atraumatic.  Cardiovascular: Normal rate, regular  rhythm, normal heart sounds and intact distal pulses.   Pulmonary/Chest: Effort normal and breath sounds normal.  Musculoskeletal: She exhibits no edema.     Neurological: She is alert and oriented to person, place, and time.  Skin: Skin is warm and dry. No rash noted.  Psychiatric: She exhibits a depressed mood.     Assessment & Plan:   There are no diagnoses linked to this encounter.  No orders of the defined types were placed in this encounter.    Follow-up: No Follow-up on file.   Boykin Nearing MD

## 2015-08-18 NOTE — Progress Notes (Signed)
[facility]18/2015 07/09/2015 04/02/2015 11/17/2014 08/15/2014  Decreased Interest 3 3 3 2 3   Down, Depressed, Hopeless 3 3 3 3  0  PHQ - 2 Score 6 6 6 5 3   Altered sleeping 2 3 3 3 3   Tired, decreased energy 3 3 3 3 3   Change in appetite 3 3 3 2  0  Feeling bad or failure about yourself  1 0 0 3 0  Trouble concentrating 1 1 1 3  0  Moving slowly or fidgety/restless 2 0 1 2 0  Suicidal thoughts 0 0 0 0 0  PHQ-9 Score 18 16 17 21 9     GAD 7 : Generalized Anxiety Score 08/18/2015 07/09/2015 07/09/2015 04/02/2015  Nervous, Anxious, on Edge 3 3 1 3   Control/stop worrying 3 3 1 3   Worry too much - different things 3 3 1 3   Trouble relaxing 2 3 2 3   Restless 2 2 1 2   Easily annoyed or irritable 3 3 1 3   Afraid - awful might happen 2 3 0 3  Total GAD 7 Score 18 20 7  20

## 2015-08-18 NOTE — Assessment & Plan Note (Signed)
A: uncontrolled Med: compliant P:  Increase insulin from 23 U BID to 31 U BID

## 2015-08-19 ENCOUNTER — Other Ambulatory Visit: Payer: Self-pay | Admitting: *Deleted

## 2015-08-19 DIAGNOSIS — E1165 Type 2 diabetes mellitus with hyperglycemia: Principal | ICD-10-CM

## 2015-08-19 DIAGNOSIS — IMO0001 Reserved for inherently not codable concepts without codable children: Secondary | ICD-10-CM

## 2015-08-19 MED ORDER — SITAGLIPTIN PHOSPHATE 100 MG PO TABS: 100.0000 mg | ORAL_TABLET | Freq: Every day | ORAL | Status: DC

## 2015-09-10 ENCOUNTER — Telehealth: Payer: Self-pay | Admitting: Family Medicine

## 2015-09-10 NOTE — Telephone Encounter (Signed)
Patient called to report that her hands are swollen and they tingle, she feels numbness.  Patient would like advice

## 2015-09-14 MED FILL — !NOVOLOG MIX 70/30 VIAL: 70-30/ML | 30 days supply | Qty: 20 | Fill #0

## 2015-09-14 MED FILL — ACETAMINOPHEN/COD #3 TABLET: 300-30 | 20 days supply | Qty: 60 | Fill #1

## 2015-09-14 MED FILL — **JANUVIA 100 MG TABLET: 100 | 28 days supply | Qty: 28 | Fill #3

## 2015-09-14 MED FILL — ?BUPROPION HCL XL 150 MG TA: 150 | 30 days supply | Qty: 30 | Fill #3

## 2015-09-14 MED FILL — !LOVAZA 1 GM CAPSULE: 1 | 30 days supply | Qty: 60 | Fill #1

## 2015-09-14 MED FILL — DULoxetine HCL 30 MG CPEP: 30 | 30 days supply | Qty: 30 | Fill #2

## 2015-09-14 MED FILL — AMITRIPTYLINE HCL 25 MG TAB: 25 | 30 days supply | Qty: 30 | Fill #3

## 2015-09-14 MED FILL — RAMIPRIL 2.5 MG CAPSULE: 2.5 | 30 days supply | Qty: 30 | Fill #2

## 2015-09-14 MED FILL — ATORVASTATIN 40 MG TABLET: 40 | 30 days supply | Qty: 30 | Fill #3

## 2015-09-23 NOTE — Telephone Encounter (Signed)
Called pt. Pt stated still with hand swollen and tingle  Advised to schedule appointment with pcp.  Call transfer to front office for appointment

## 2015-10-01 ENCOUNTER — Ambulatory Visit: Payer: No Typology Code available for payment source | Attending: Family Medicine | Admitting: Family Medicine

## 2015-10-01 ENCOUNTER — Encounter: Payer: Self-pay | Admitting: Family Medicine

## 2015-10-01 ENCOUNTER — Telehealth: Payer: Self-pay | Admitting: Family Medicine

## 2015-10-01 VITALS — BP 112/76 | HR 71 | Temp 97.9°F | Resp 16 | Ht 62.5 in | Wt 193.0 lb

## 2015-10-01 DIAGNOSIS — E1165 Type 2 diabetes mellitus with hyperglycemia: Secondary | ICD-10-CM

## 2015-10-01 DIAGNOSIS — F32A Depression, unspecified: Secondary | ICD-10-CM

## 2015-10-01 DIAGNOSIS — Z79899 Other long term (current) drug therapy: Secondary | ICD-10-CM | POA: Insufficient documentation

## 2015-10-01 DIAGNOSIS — M7989 Other specified soft tissue disorders: Secondary | ICD-10-CM

## 2015-10-01 DIAGNOSIS — IMO0001 Reserved for inherently not codable concepts without codable children: Secondary | ICD-10-CM

## 2015-10-01 DIAGNOSIS — E119 Type 2 diabetes mellitus without complications: Secondary | ICD-10-CM | POA: Insufficient documentation

## 2015-10-01 DIAGNOSIS — K219 Gastro-esophageal reflux disease without esophagitis: Secondary | ICD-10-CM

## 2015-10-01 DIAGNOSIS — F329 Major depressive disorder, single episode, unspecified: Secondary | ICD-10-CM

## 2015-10-01 DIAGNOSIS — Z794 Long term (current) use of insulin: Secondary | ICD-10-CM | POA: Insufficient documentation

## 2015-10-01 DIAGNOSIS — F17210 Nicotine dependence, cigarettes, uncomplicated: Secondary | ICD-10-CM | POA: Insufficient documentation

## 2015-10-01 LAB — GLUCOSE, POCT (MANUAL RESULT ENTRY): POC GLUCOSE: 155 mg/dL — AB (ref 70–99)

## 2015-10-01 LAB — POCT GLYCOSYLATED HEMOGLOBIN (HGB A1C): HEMOGLOBIN A1C: 8.3

## 2015-10-01 MED ORDER — RANITIDINE HCL 300 MG PO TABS: 300.0000 mg | ORAL_TABLET | Freq: Every day | ORAL | Status: DC

## 2015-10-01 MED ORDER — GLIMEPIRIDE 4 MG PO TABS: 4.0000 mg | ORAL_TABLET | Freq: Every day | ORAL | Status: DC

## 2015-10-01 MED ORDER — INSULIN ASPART PROT & ASPART (70-30 MIX) 100 UNIT/ML ~~LOC~~ SUSP: 35.0000 [IU] | Freq: Two times a day (BID) | SUBCUTANEOUS | Status: DC

## 2015-10-01 MED ORDER — DULOXETINE HCL 60 MG PO CPEP: 60.0000 mg | ORAL_CAPSULE | Freq: Every day | ORAL | Status: DC

## 2015-10-01 MED FILL — raNITIdine HCL 150 MG TABS: 150 | 30 days supply | Qty: 60 | Fill #0

## 2015-10-01 MED FILL — ?GLIMEPIRIDE 4 MG TABLET: 4 | 30 days supply | Qty: 30 | Fill #0

## 2015-10-01 MED FILL — DULoxetine HCL 60 MG CPEP: 60 | 30 days supply | Qty: 30 | Fill #0

## 2015-10-01 MED FILL — !NOVOLOG 70/30 FLEXPEN: 70-30 | 30 days supply | Qty: 21 | Fill #0

## 2015-10-01 NOTE — Patient Instructions (Addendum)
Toniann FailWendy was seen today for diabetes and depression.  Diagnoses and all orders for this visit:  Uncontrolled type 2 diabetes mellitus without complication, with long-term current use of insulin (HCC) -     HgB A1c -     Glucose (CBG) -     POCT glycosylated hemoglobin (Hb A1C) -     POCT glucose (manual entry)  Depression -     DULoxetine (CYMBALTA) 60 MG capsule; Take 1 capsule (60 mg total) by mouth daily.  Uncontrolled type 2 diabetes mellitus without complication, without long-term current use of insulin (HCC) -     insulin aspart protamine- aspart (NOVOLOG MIX 70/30) (70-30) 100 UNIT/ML injection; Inject 0.35 mLs (35 Units total) into the skin 2 (two) times daily with a meal. -     glimepiride (AMARYL) 4 MG tablet; Take 1 tablet (4 mg total) by mouth daily before breakfast.   Take naproxen for hand pain  Aim for 2000 IU of D3 daily to prevent low vit D   F/u in 6 weeks for depression   Dr. Armen PickupFunches

## 2015-10-01 NOTE — Assessment & Plan Note (Signed)
At H2 blocker GERD diet Smoking cessation

## 2015-10-01 NOTE — Assessment & Plan Note (Signed)
Swelling with intermittent pain Mild No redness No joint deformity  Plan NSAID

## 2015-10-01 NOTE — Progress Notes (Signed)
F/U DM, Depression  Glucose running 160-170 C/C hands swelling pain on Rt hand on and off  Unable to hold a cup or open a lid  Taking medication as prescribed  No suicidal thoughts in the past two weeks  No pain today

## 2015-10-01 NOTE — Progress Notes (Signed)
Subjective:  Patient ID: Sara Vasquez, female    DOB: 1965/05/08  Age: 49 y.o. MRN: [id number]  CC: No chief complaint on file.   HPI Sara Vasquez presents for    1. Depression: slight improvement with cymbalta. No SI. Enjoys baking. Stress with grandson and his mother in her house, step son in [employer], estranged from 2 biological children. Smoking and eating relieves stress. Not ready to quit smoking.  Hoping that her grandson and his mother will move out in one week. Has a peaceful and supportive relationship with her husband.   2. Diabetes: has cut out soda. Compliant with medication. Sometimes take increase dose of insulin up to 30 U. No low CBGs. Range is 80-170. 80s occur at 4-5 PM.   3. Hand pain:  X 2 weeks.  With swelling. Pain interferes with grip strength. She is R handed.  No pain currently. Has not taking naproxen since she takes tylenol #3 for back pain. No hand redness or injury. Mom with RA. She is compliant with low salt diet.   4. Acid reflux: for many months. Worsening. Taking OTC tums. Symptoms are worse at night and early AM. No weight loss. No fever or chills. She is a smoker. She tries to avoid spicy foods. She is not drinking carbonated beverages. No ETOH.   Social History  Substance Use Topics  . Smoking status: Current Every Day Smoker -- 1.00 packs/day for 35 years    Types: Cigarettes    Start date: 03/27/2014  . Smokeless tobacco: Not on file  . Alcohol Use: No    Outpatient Prescriptions Prior to Visit  Medication Sig Dispense Refill  . acetaminophen-codeine (TYLENOL #3) 300-30 MG tablet Take 1 tablet by mouth every 8 (eight) hours as needed for moderate pain. 60 tablet 2  . atorvastatin (LIPITOR) 40 MG tablet Take 1 tablet (40 mg total) by mouth daily. 30 tablet 11  . Blood Glucose Monitoring Suppl (TRUE METRIX METER) w/Device KIT USE AS DIRECTED 1 kit 0  . DULoxetine (CYMBALTA) 30 MG capsule Take 1 capsule (30 mg total) by mouth daily. 30 capsule 5   . gemfibrozil (LOPID) 600 MG tablet TAKE 1 TABLET BY MOUTH 2 TIMES DAILY BEFORE A MEAL 60 tablet 5  . glimepiride (AMARYL) 4 MG tablet Take 2 tablets (8 mg total) by mouth daily before breakfast. 60 tablet 5  . glucose blood (TRUE METRIX BLOOD GLUCOSE TEST) test strip 1 each by Other route 3 (three) times daily. 100 each 11  . hydrOXYzine (ATARAX/VISTARIL) 10 MG tablet Take 1 tablet (10 mg total) by mouth 3 (three) times daily as needed. 30 tablet 2  . insulin aspart protamine- aspart (NOVOLOG MIX 70/30) (70-30) 100 UNIT/ML injection Inject 0.32 mLs (32 Units total) into the skin 2 (two) times daily with a meal. 50 mL 3  . methocarbamol (ROBAXIN-750) 750 MG tablet Take 1 tablet (750 mg total) by mouth every 8 (eight) hours as needed for muscle spasms. 30 tablet 0  . naproxen (NAPROSYN) 500 MG tablet Take 1 tablet (500 mg total) by mouth 2 (two) times daily. 30 tablet 0  . omega-3 acid ethyl esters (LOVAZA) 1 g capsule Take 1 capsule (1 g total) by mouth 2 (two) times daily. 180 capsule 3  . ramipril (ALTACE) 2.5 MG capsule TAKE 1 CAPSULE BY MOUTH ONCE DAILY 90 capsule 3  . sitaGLIPtin (JANUVIA) 100 MG tablet Take 1 tablet (100 mg total) by mouth daily. 90 tablet 3  .  sodium chloride (OCEAN) 0.65 % SOLN nasal spray Place 1 spray into both nostrils as needed for congestion. 15 mL 3   No facility-administered medications prior to visit.    ROS Review of Systems  Constitutional: Negative for fever and chills.  Eyes: Negative for visual disturbance.  Respiratory: Negative for shortness of breath.   Cardiovascular: Negative for chest pain.  Gastrointestinal: Negative for abdominal pain and blood in stool.  Musculoskeletal: Negative for back pain and arthralgias.  Skin: Negative for rash.  Allergic/Immunologic: Negative for immunocompromised state.  Hematological: Negative for adenopathy. Does not bruise/bleed easily.  Psychiatric/Behavioral: Positive for sleep disturbance and dysphoric mood.  Negative for suicidal ideas. The patient is nervous/anxious.     Objective:  BP 112/76 mmHg  Pulse 71  Temp(Src) 97.9 F (36.6 C) (Oral)  Resp 16  Ht 5' 2.5" (1.588 m)  Wt 193 lb (87.544 kg)  BMI 34.72 kg/m2  SpO2 98%  BP/Weight 10/01/2015 08/18/2015 1/42/3953  Systolic BP 202 334 356  Diastolic BP 76 85 77  Wt. (Lbs) 193 190 190  BMI 34.72 34.18 34.18   Physical Exam  Constitutional: She is oriented to person, place, and time. She appears well-developed and well-nourished. No distress.  HENT:  Head: Normocephalic and atraumatic.  Cardiovascular: Normal rate, regular rhythm, normal heart sounds and intact distal pulses.   Pulmonary/Chest: Effort normal and breath sounds normal.  Musculoskeletal: She exhibits no edema.       Right hand: She exhibits decreased range of motion and swelling (mild ). She exhibits no tenderness, normal two-point discrimination, normal capillary refill, no deformity and no laceration. Normal sensation noted. Normal strength noted.       Left hand: She exhibits decreased range of motion and swelling. She exhibits no tenderness, no bony tenderness, normal capillary refill, no deformity and no laceration. Normal sensation noted. Normal strength noted.     Neurological: She is alert and oriented to person, place, and time.  Skin: Skin is warm and dry. No rash noted.  Psychiatric: She exhibits a depressed mood.   Lab Results  Component Value Date   HGBA1C 9.3 07/10/2015   Lab Results  Component Value Date   HGBA1C 8.3 10/01/2015    CBG 155 Depression screen [facility] 2/9 10/01/2015 08/18/2015 07/09/2015 04/02/2015 11/17/2014  Decreased Interest _0 Down, Depressed, Hopeless _1 PHQ - 2 Score _2 Altered sleeping _3 Tired, decreased energy _4 Change in appetite _5 Feeling bad or failure about yourself  1 1 0 0 3  Trouble concentrating _6 Moving slowly or fidgety/restless 1 2 0 1 2  Suicidal thoughts 0 0 0  0 0  PHQ-9 Score _7 GAD 7 : Generalized Anxiety Score 10/01/2015 08/18/2015 07/09/2015 07/09/2015  Nervous, Anxious, on Edge _8 Control/stop worrying _9 Worry too much - different things _10 Trouble relaxing _11 Restless _12 Easily annoyed or irritable _13 Afraid - awful might happen _14 0  Total GAD 7 Score _15 Assessment & Plan:   There are no diagnoses linked to this encounter.  Sara Vasquez was seen today for diabetes and  depression.  Diagnoses and all orders for this visit:  Uncontrolled type 2 diabetes mellitus without complication, with long-term current use of insulin (HCC)  Depression -     DULoxetine (CYMBALTA) 60 MG capsule; Take 1 capsule (60 mg total) by mouth daily.  Uncontrolled type 2 diabetes mellitus without complication, without long-term current use of insulin (HCC) -     Cancel: HgB A1c -     Glucose (CBG) -     POCT glycosylated hemoglobin (Hb A1C) -     Cancel: POCT glucose (manual entry) -     insulin aspart protamine- aspart (NOVOLOG MIX 70/30) (70-30) 100 UNIT/ML injection; Inject 0.35 mLs (35 Units total) into the skin 2 (two) times daily with a meal. -     glimepiride (AMARYL) 4 MG tablet; Take 1 tablet (4 mg total) by mouth daily before breakfast.  Gastroesophageal reflux disease, esophagitis presence not specified -     ranitidine (ZANTAC) 300 MG tablet; Take 1 tablet (300 mg total) by mouth at bedtime.   Meds ordered this encounter  Medications  . DULoxetine (CYMBALTA) 60 MG capsule    Sig: Take 1 capsule (60 mg total) by mouth daily.    Dispense:  30 capsule    Refill:  5    Dc 30 mg cymbalta  . insulin aspart protamine- aspart (NOVOLOG MIX 70/30) (70-30) 100 UNIT/ML injection    Sig: Inject 0.35 mLs (35 Units total) into the skin 2 (two) times daily with a meal.    Dispense:  50 mL    Refill:  3    Note increase in 70/30 from 32 U to 35 U  . glimepiride (AMARYL) 4 MG tablet    Sig:  Take 1 tablet (4 mg total) by mouth daily before breakfast.    Dispense:  30 tablet    Refill:  5    Note change in amaryl from 8 mg to 4 mg  . ranitidine (ZANTAC) 300 MG tablet    Sig: Take 1 tablet (300 mg total) by mouth at bedtime.    Dispense:  30 tablet    Refill:  1    Follow-up: No Follow-up on file.   Boykin Nearing MD

## 2015-10-01 NOTE — Telephone Encounter (Signed)
Patient is supposed to be prescribed naproxen. Naproxen is to be used for hand pain

## 2015-10-01 NOTE — Assessment & Plan Note (Signed)
Improving Increase 70/30 to 35 U BID Decrease amaryl from 8 to 4 mg in AM to prevent low CBGs in early evening Continue januvia 100 mg daily

## 2015-10-08 MED ORDER — NAPROXEN 500 MG PO TABS: 500.0000 mg | ORAL_TABLET | Freq: Two times a day (BID) | ORAL | Status: DC

## 2015-10-08 NOTE — Telephone Encounter (Signed)
Naproxen refilled. 

## 2015-11-05 MED FILL — RAMIPRIL 2.5 MG CAPSULE: 2.5 | 30 days supply | Qty: 30 | Fill #3

## 2015-11-05 MED FILL — GLIMEPIRIDE 4 MG TABLET: 4 | 30 days supply | Qty: 30 | Fill #1

## 2015-11-05 MED FILL — **JANUVIA 100 MG TABLET: 100 | 28 days supply | Qty: 28 | Fill #4

## 2015-11-05 MED FILL — ATORVASTATIN 40 MG TABLET: 40 | 30 days supply | Qty: 30 | Fill #4

## 2015-11-05 MED FILL — ?BUPROPION HCL XL 150 MG TA: 150 | 30 days supply | Qty: 30 | Fill #4

## 2015-11-05 MED FILL — OMEGA-3 ETHYL ESTERS 1 GM C: 1 | 30 days supply | Qty: 60 | Fill #2

## 2015-11-05 MED FILL — AMITRIPTYLINE HCL 25 MG TAB: 25 | 30 days supply | Qty: 30 | Fill #4

## 2015-11-05 MED FILL — DULoxetine HCL 60 MG CPEP: 60 | 30 days supply | Qty: 30 | Fill #1

## 2015-11-05 MED FILL — raNITIdine HCL 150 MG TABS: 150 | 30 days supply | Qty: 60 | Fill #1

## 2015-11-23 ENCOUNTER — Ambulatory Visit: Payer: Self-pay | Attending: Family Medicine | Admitting: Family Medicine

## 2015-11-23 ENCOUNTER — Encounter: Payer: Self-pay | Admitting: Family Medicine

## 2015-11-23 VITALS — BP 111/69 | HR 69 | Temp 98.2°F | Resp 17 | Ht 62.5 in | Wt 192.2 lb

## 2015-11-23 DIAGNOSIS — F17210 Nicotine dependence, cigarettes, uncomplicated: Secondary | ICD-10-CM | POA: Insufficient documentation

## 2015-11-23 DIAGNOSIS — Z5900 Homelessness unspecified: Secondary | ICD-10-CM | POA: Insufficient documentation

## 2015-11-23 DIAGNOSIS — R5383 Other fatigue: Secondary | ICD-10-CM | POA: Insufficient documentation

## 2015-11-23 DIAGNOSIS — F329 Major depressive disorder, single episode, unspecified: Secondary | ICD-10-CM | POA: Insufficient documentation

## 2015-11-23 DIAGNOSIS — S99922A Unspecified injury of left foot, initial encounter: Secondary | ICD-10-CM

## 2015-11-23 DIAGNOSIS — Z79899 Other long term (current) drug therapy: Secondary | ICD-10-CM | POA: Insufficient documentation

## 2015-11-23 DIAGNOSIS — S99822A Other specified injuries of left foot, initial encounter: Secondary | ICD-10-CM | POA: Insufficient documentation

## 2015-11-23 DIAGNOSIS — W230XXA Caught, crushed, jammed, or pinched between moving objects, initial encounter: Secondary | ICD-10-CM | POA: Insufficient documentation

## 2015-11-23 DIAGNOSIS — Y92009 Unspecified place in unspecified non-institutional (private) residence as the place of occurrence of the external cause: Secondary | ICD-10-CM | POA: Insufficient documentation

## 2015-11-23 DIAGNOSIS — S99929A Unspecified injury of unspecified foot, initial encounter: Secondary | ICD-10-CM | POA: Insufficient documentation

## 2015-11-23 DIAGNOSIS — Y9389 Activity, other specified: Secondary | ICD-10-CM | POA: Insufficient documentation

## 2015-11-23 DIAGNOSIS — F4311 Post-traumatic stress disorder, acute: Secondary | ICD-10-CM | POA: Insufficient documentation

## 2015-11-23 DIAGNOSIS — F32A Depression, unspecified: Secondary | ICD-10-CM

## 2015-11-23 LAB — CBC
HCT: 44.8 % (ref 35.0–45.0)
Hemoglobin: 15.3 g/dL (ref 11.7–15.5)
MCH: 30.6 pg (ref 27.0–33.0)
MCHC: 34.2 g/dL (ref 32.0–36.0)
MCV: 89.6 fL (ref 80.0–100.0)
MPV: 9.7 fL (ref 7.5–12.5)
PLATELETS: 341 10*3/uL (ref 140–400)
RBC: 5 MIL/uL (ref 3.80–5.10)
RDW: 13.9 % (ref 11.0–15.0)
WBC: 12.1 10*3/uL — AB (ref 3.8–10.8)

## 2015-11-23 LAB — IRON AND TIBC
%SAT: 21 % (ref 11–50)
IRON: 87 ug/dL (ref 40–190)
TIBC: 415 ug/dL (ref 250–450)
UIBC: 328 ug/dL (ref 125–400)

## 2015-11-23 LAB — FERRITIN: Ferritin: 204 ng/mL (ref 10–232)

## 2015-11-23 LAB — TSH: TSH: 1.11 mIU/L

## 2015-11-23 MED ORDER — TRAZODONE HCL 50 MG PO TABS: 25.0000 mg | ORAL_TABLET | Freq: Every evening | ORAL | 3 refills | Status: DC | PRN

## 2015-11-23 MED ORDER — HYDROXYZINE HCL 10 MG PO TABS: 10.0000 mg | ORAL_TABLET | Freq: Three times a day (TID) | ORAL | 5 refills | Status: DC | PRN

## 2015-11-23 MED FILL — hydrOXYzine HCL 10 MG TABS: 10 | 10 days supply | Qty: 30 | Fill #0

## 2015-11-23 MED FILL — $NOVOLOG MIX 70/30 VIAL: (70-30) 100 | 28 days supply | Qty: 20 | Fill #0

## 2015-11-23 MED FILL — traZODone HCL 50 MG TABS: 50 | 30 days supply | Qty: 30 | Fill #0

## 2015-11-23 NOTE — Progress Notes (Signed)
Patient ID: SHALINI MAIR, female   DOB: [birth year], 50 y.o.   MRN: [id number]   Subjective:  Patient ID: BLANKA ROCKHOLT, female    DOB: 11/17/1965  Age: 50 y.o. MRN: [id number]  CC: Follow-up and Depression   HPI EILEENE KISLING presents for    1. Depression: slight improvement with cymbalta. No SI. She has trouble sleeping and fatigue.  She took one of her mom's trazodone and this helped. She reports being homeless for the past 2 months. She and her husband live in their car. Her grandson and his mother have moved with family in [place]. She is applying for disability she comes today with a form for functional capacity evaluation.   2. Toe pain: she jammed her L 5th toe on her nieces couch last night. There is mild swelling and tenderness at the tip. No open skin. She is able to walk.   Social History  Substance Use Topics  . Smoking status: Current Every Day Smoker    Packs/day: 1.00    Years: 35.00    Types: Cigarettes    Start date: 03/27/2014  . Smokeless tobacco: Not on file  . Alcohol use No    Outpatient Medications Prior to Visit  Medication Sig Dispense Refill  . acetaminophen-codeine (TYLENOL #3) 300-30 MG tablet Take 1 tablet by mouth every 8 (eight) hours as needed for moderate pain. 60 tablet 2  . atorvastatin (LIPITOR) 40 MG tablet Take 1 tablet (40 mg total) by mouth daily. 30 tablet 11  . Blood Glucose Monitoring Suppl (TRUE METRIX METER) w/Device KIT USE AS DIRECTED 1 kit 0  . cholecalciferol (VITAMIN D) 1000 units tablet Take 1,000 Units by mouth daily.    . DULoxetine (CYMBALTA) 60 MG capsule Take 1 capsule (60 mg total) by mouth daily. 30 capsule 5  . glimepiride (AMARYL) 4 MG tablet Take 1 tablet (4 mg total) by mouth daily before breakfast. 30 tablet 5  . glucose blood (TRUE METRIX BLOOD GLUCOSE TEST) test strip 1 each by Other route 3 (three) times daily. 100 each 11  . insulin aspart protamine- aspart (NOVOLOG MIX 70/30) (70-30) 100 UNIT/ML injection Inject  0.35 mLs (35 Units total) into the skin 2 (two) times daily with a meal. 50 mL 3  . methocarbamol (ROBAXIN-750) 750 MG tablet Take 1 tablet (750 mg total) by mouth every 8 (eight) hours as needed for muscle spasms. 30 tablet 0  . Multiple Vitamins-Minerals (MULTIVITAL) tablet Take 1 tablet by mouth daily.    . naproxen (NAPROSYN) 500 MG tablet Take 1 tablet (500 mg total) by mouth 2 (two) times daily with a meal. 60 tablet 3  . omega-3 acid ethyl esters (LOVAZA) 1 g capsule Take 1 capsule (1 g total) by mouth 2 (two) times daily. 180 capsule 3  . ramipril (ALTACE) 2.5 MG capsule TAKE 1 CAPSULE BY MOUTH ONCE DAILY 90 capsule 3  . ranitidine (ZANTAC) 300 MG tablet Take 1 tablet (300 mg total) by mouth at bedtime. 30 tablet 1  . sitaGLIPtin (JANUVIA) 100 MG tablet Take 1 tablet (100 mg total) by mouth daily. 90 tablet 3  . gemfibrozil (LOPID) 600 MG tablet TAKE 1 TABLET BY MOUTH 2 TIMES DAILY BEFORE A MEAL (Patient not taking: Reported on 11/23/2015) 60 tablet 5  . hydrOXYzine (ATARAX/VISTARIL) 10 MG tablet Take 1 tablet (10 mg total) by mouth 3 (three) times daily as needed. (Patient not taking: Reported on 11/23/2015) 30 tablet 2  . sodium chloride (OCEAN) 0.65 %  SOLN nasal spray Place 1 spray into both nostrils as needed for congestion. 15 mL 3   No facility-administered medications prior to visit.     ROS Review of Systems  Constitutional: Positive for fatigue. Negative for chills and fever.  Eyes: Negative for visual disturbance.  Respiratory: Negative for shortness of breath.   Cardiovascular: Negative for chest pain.  Gastrointestinal: Negative for abdominal pain and blood in stool.  Musculoskeletal: Positive for arthralgias, back pain, gait problem and joint swelling. Negative for myalgias, neck pain and neck stiffness.  Skin: Negative for rash.  Allergic/Immunologic: Negative for immunocompromised state.  Hematological: Negative for adenopathy. Does not bruise/bleed easily.    Psychiatric/Behavioral: Positive for dysphoric mood and sleep disturbance. Negative for suicidal ideas. The patient is nervous/anxious.     Objective:  BP 111/69 (BP Location: Right Arm, Patient Position: Sitting, Cuff Size: Large)   Pulse 69   Temp 98.2 F (36.8 C) (Oral)   Resp 17   Ht 5' 2.5" (1.588 m)   Wt 192 lb 3.2 oz (87.2 kg)   SpO2 97%   BMI 34.59 kg/m   BP/Weight 11/23/2015 10/01/2015 9/67/8938  Systolic BP 101 751 025  Diastolic BP 69 76 85  Wt. (Lbs) 192.2 193 190  BMI 34.59 34.72 34.18   Physical Exam  Constitutional: She is oriented to person, place, and time. She appears well-developed and well-nourished. No distress.  HENT:  Head: Normocephalic and atraumatic.  Cardiovascular: Normal rate, regular rhythm, normal heart sounds and intact distal pulses.   Pulmonary/Chest: Effort normal and breath sounds normal.  Musculoskeletal: She exhibits no edema.       Right hand: She exhibits decreased range of motion and swelling (mild ). She exhibits no tenderness, normal two-point discrimination, normal capillary refill, no deformity and no laceration. Normal sensation noted. Normal strength noted.       Left hand: She exhibits decreased range of motion and swelling. She exhibits no tenderness, no bony tenderness, normal capillary refill, no deformity and no laceration. Normal sensation noted. Normal strength noted.       Feet:     Neurological: She is alert and oriented to person, place, and time.  Skin: Skin is warm and dry. No rash noted.  Psychiatric: She exhibits a depressed mood.   Lab Results  Component Value Date   HGBA1C 8.3 10/01/2015    CBG 155 Depression screen [facility] 2/9 11/23/2015 10/01/2015 08/18/2015 07/09/2015 04/02/2015  Decreased Interest _0 Down, Depressed, Hopeless _1 PHQ - 2 Score _2 Altered sleeping _3 Tired, decreased energy _4 Change in appetite _5 Feeling bad or failure about yourself  0 1 1 0 0   Trouble concentrating _6 Moving slowly or fidgety/restless _7 0 1  Suicidal thoughts 0 0 0 0 0  PHQ-9 Score _8 GAD 7 : Generalized Anxiety Score 11/23/2015 10/01/2015 08/18/2015 07/09/2015  Nervous, Anxious, on Edge _9 Control/stop worrying _10 Worry too much - different things _11 Trouble relaxing _12 Restless _13 Easily annoyed or irritable _14 Afraid - awful might happen _15 Total GAD 7 Score _16 20  Assessment & Plan:  Allexa was seen today for follow-up and depression.  Diagnoses and all orders for this visit:  Depression -     traZODone (DESYREL) 50 MG tablet; Take 0.5-1 tablets (25-50 mg total) by mouth at bedtime as needed for sleep.  Acute posttraumatic stress disorder -     hydrOXYzine (ATARAX/VISTARIL) 10 MG tablet; Take 1 tablet (10 mg total) by mouth 3 (three) times daily as needed.  Other fatigue -     CBC -     Iron and TIBC -     Ferritin -     TSH  Homelessness  Toe injury, left, initial encounter   There are no diagnoses linked to this encounter.  Desiree was seen today for follow-up and depression.  Diagnoses and all orders for this visit:  Depression -     traZODone (DESYREL) 50 MG tablet; Take 0.5-1 tablets (25-50 mg total) by mouth at bedtime as needed for sleep.  Acute posttraumatic stress disorder -     hydrOXYzine (ATARAX/VISTARIL) 10 MG tablet; Take 1 tablet (10 mg total) by mouth 3 (three) times daily as needed.  Other fatigue -     CBC -     Iron and TIBC -     Ferritin -     TSH  Homelessness  Toe injury, left, initial encounter   No orders of the defined types were placed in this encounter.   Follow-up: Return in about 4 weeks (around 12/21/2015) for disability paperwork .   Boykin Nearing MD

## 2015-11-23 NOTE — Assessment & Plan Note (Addendum)
Depression in setting of significant stressors: unemployed, uninsured, homeless  Continue cymbalta Add trazodone for insomnia Provided list of housing resources Plan to fill out paperwork for functional capacity at f/u visit

## 2015-11-23 NOTE — Progress Notes (Signed)
cbg 158 

## 2015-11-23 NOTE — Assessment & Plan Note (Signed)
Injury to L 5th toe last night  No dislocation Strained tendons favored over fracture  Plan: buddy tape, ice, elevate

## 2015-11-23 NOTE — Assessment & Plan Note (Signed)
Fatigue is most likely due to depression but will check CBC, TSH and iron levels to r/o concomitant endocrine disorder or anemia

## 2015-11-23 NOTE — Patient Instructions (Addendum)
Sara Vasquez was seen today for follow-up and depression.  Diagnoses and all orders for this visit:  Depression -     traZODone (DESYREL) 50 MG tablet; Take 0.5-1 tablets (25-50 mg total) by mouth at bedtime as needed for sleep.  Acute posttraumatic stress disorder -     hydrOXYzine (ATARAX/VISTARIL) 10 MG tablet; Take 1 tablet (10 mg total) by mouth 3 (three) times daily as needed.  Other fatigue -     CBC -     Iron and TIBC -     Ferritin -     TSH  Homelessness  Toe injury, left, initial encounter   F/u in 4-6 weeks for paperwork for disability functional capacity office visit, 30 minutes please   Dr. Armen Pickup   Buddy Taping You have a minor finger or toe injury. It can be managed by buddy taping. Buddy taping means the injured finger or toe is taped to a healthy uninjured adjacent finger or toe. Most minor fractures and dislocations of the smaller fingers and toes will heal in 3 to 4 weeks. Buddy taping immobilizes and protects the area of injury. Buddy taping is not recommended for initial treatment of fractures of the thumb, longer fingers, or the great toe. Buddy taping should not be used for unstable or deformed fractures, but as fracture healing progresses it may be used for protection during rehabilitation. Fractured fingers and toes should be protected by buddy taping as long as the injury is still painful or swollen.  When an injury is buddy taped, place a small piece of gauze or cotton between the digits that are taped. This helps prevent the skin from breaking down from increased moisture. Buddy taping allows you to get your injury wet when you bathe. Change the gauze and tape more often if it gets wet, and dry the space between the finger or toes. Use a sturdy, hard-soled shoe for better support if you have a fractured toe. In 2 to 3 weeks you can start motion exercises. This will keep the fingers or toes from becoming stiff.  SEEK IMMEDIATE MEDICAL CARE IF:   The injured area  becomes cold, numb, or pale.  You have pain not controlled with medications.  You notice increasing deformity of the toe or finger.   This information is not intended to replace advice given to you by your health care provider. Make sure you discuss any questions you have with your health care provider.   Document Released: 05/19/2004 Document Revised: 05/02/2014 Document Reviewed: 09/03/2014 Elsevier Interactive Patient Education [employer]

## 2015-11-25 ENCOUNTER — Telehealth: Payer: Self-pay

## 2015-11-25 NOTE — Telephone Encounter (Signed)
Patient phone number on file is a non working number. I tried to call her mom [facility]) there was no answer. Her results will be mailed out to her today.

## 2015-12-02 ENCOUNTER — Other Ambulatory Visit: Payer: Self-pay | Admitting: Family Medicine

## 2015-12-02 DIAGNOSIS — K219 Gastro-esophageal reflux disease without esophagitis: Secondary | ICD-10-CM

## 2015-12-02 MED FILL — BUPROPION HCL XL 150 MG TAB: 150 | 30 days supply | Qty: 30 | Fill #5

## 2015-12-02 MED FILL — RAMIPRIL 2.5 MG CAPSULE: 2.5 | 30 days supply | Qty: 30 | Fill #4

## 2015-12-02 MED FILL — **JANUVIA 100 MG TABLET: 100 | 28 days supply | Qty: 28 | Fill #5

## 2015-12-02 MED FILL — ?ATORVASTATIN 40MG TABLET: 40 | 30 days supply | Qty: 30 | Fill #5

## 2015-12-02 MED FILL — !TRUE METRIX BLOOD GLUCOSE: 1 days supply | Qty: 1 | Fill #0

## 2015-12-02 MED FILL — DULoxetine HCL 60 MG CPEP: 60 | 30 days supply | Qty: 30 | Fill #2

## 2015-12-02 MED FILL — raNITIdine HCL 150 MG TABS: 150 | 30 days supply | Qty: 60 | Fill #0

## 2015-12-02 MED FILL — OMEGA-3 ETHYL ESTERS 1 GM C: 1 | 30 days supply | Qty: 60 | Fill #3

## 2015-12-26 ENCOUNTER — Emergency Department
Admission: EM | Admit: 2015-12-26 | Discharge: 2015-12-26 | Disposition: A | Discharge status: Home / Self Care | Payer: Self-pay | Attending: Emergency Medicine | Admitting: Emergency Medicine

## 2015-12-26 ENCOUNTER — Encounter: Payer: Self-pay | Admitting: Emergency Medicine

## 2015-12-26 DIAGNOSIS — F17210 Nicotine dependence, cigarettes, uncomplicated: Secondary | ICD-10-CM | POA: Insufficient documentation

## 2015-12-26 DIAGNOSIS — S61411A Laceration without foreign body of right hand, initial encounter: Secondary | ICD-10-CM | POA: Insufficient documentation

## 2015-12-26 DIAGNOSIS — Z794 Long term (current) use of insulin: Secondary | ICD-10-CM | POA: Insufficient documentation

## 2015-12-26 DIAGNOSIS — Y92000 Kitchen of unspecified non-institutional (private) residence as  the place of occurrence of the external cause: Secondary | ICD-10-CM | POA: Insufficient documentation

## 2015-12-26 DIAGNOSIS — E119 Type 2 diabetes mellitus without complications: Secondary | ICD-10-CM | POA: Insufficient documentation

## 2015-12-26 DIAGNOSIS — Z23 Encounter for immunization: Secondary | ICD-10-CM | POA: Insufficient documentation

## 2015-12-26 DIAGNOSIS — Y999 Unspecified external cause status: Secondary | ICD-10-CM | POA: Insufficient documentation

## 2015-12-26 DIAGNOSIS — W3182XA Contact with other commercial machinery, initial encounter: Secondary | ICD-10-CM | POA: Insufficient documentation

## 2015-12-26 DIAGNOSIS — Y93G9 Activity, other involving cooking and grilling: Secondary | ICD-10-CM | POA: Insufficient documentation

## 2015-12-26 DIAGNOSIS — I10 Essential (primary) hypertension: Secondary | ICD-10-CM | POA: Insufficient documentation

## 2015-12-26 DIAGNOSIS — Z79899 Other long term (current) drug therapy: Secondary | ICD-10-CM | POA: Insufficient documentation

## 2015-12-26 HISTORY — DX: Depression, unspecified: F32.A

## 2015-12-26 HISTORY — DX: Unspecified osteoarthritis, unspecified site: M19.90

## 2015-12-26 HISTORY — DX: Anxiety disorder, unspecified: F41.9

## 2015-12-26 HISTORY — DX: Major depressive disorder, single episode, unspecified: F32.9

## 2015-12-26 MED ORDER — TETANUS-DIPHTH-ACELL PERTUSSIS 5-2.5-18.5 LF-MCG/0.5 IM SUSP
0.5000 mL | Freq: Once | INTRAMUSCULAR | Status: AC
  Administered 2015-12-26: 0.5 mL via INTRAMUSCULAR
  Filled 2015-12-26: qty 0.5

## 2015-12-26 MED ORDER — IBUPROFEN 800 MG PO TABS
800.0000 mg | ORAL_TABLET | Freq: Once | ORAL | Status: AC
  Administered 2015-12-26: 800 mg via ORAL
  Filled 2015-12-26: qty 1

## 2015-12-26 NOTE — ED Triage Notes (Signed)
Pt states she took at Tylenol #3 around 530pm today. Pt states she takes Aleve.  Pt has hand elevated at this time.

## 2015-12-26 NOTE — ED Triage Notes (Signed)
Pt arrived to ED via POV with c/o laceration to right palm.  Pt states she was using a slicer to slice potatoes and cut herself with slicer.  Pt has hand wrapped with coban.  Pt able to move extremity without difficulty. Pt is a diabetic. Unknown when last tetanus shot was.

## 2015-12-26 NOTE — ED Provider Notes (Signed)
[facility] Emergency Department Provider Note  ____________________________________________  Time seen: Approximately 7:10 PM  I have reviewed the triage vital signs and the nursing notes.   HISTORY  Chief Complaint Laceration    HPI Sara Vasquez is a 50 y.o. female who presents emergency department complaining of laceration to the right hand. Patient states that she was cutting potatoes with a potato slicer when the edge cut her hand. Patient reports that there is a laceration in the thenar eminence area. Bleeding was controlled with direct pressure. No other complaint or injury. Patient is not current on her tetanus immunization.   Past Medical History:  Diagnosis Date  . Anxiety   . Arthritis   . Depression   . Diabetes mellitus without complication ([place])   . Hypertension     Patient Active Problem List   Diagnosis Date Noted  . Homelessness 11/23/2015  . Toe injury 11/23/2015  . Other fatigue 11/23/2015  . Esophageal reflux 10/01/2015  . Bilateral hand swelling 10/01/2015  . Chronic low back pain 07/09/2015  . Elevated triglycerides with high cholesterol 06/19/2015  . Traumatic myalgia 04/02/2015  . Acute posttraumatic stress disorder 04/02/2015  . Diabetes type 2, uncontrolled ([place]) 02/04/2015  . Tendinopathy of right rotator cuff 02/04/2015  . Vitamin D deficiency 03/19/2014  . Abnormal LFTs 03/19/2014  . Hyperlipidemia associated with type 2 diabetes mellitus ([place]) 03/19/2014  . Essential hypertension, benign 12/19/2013  . Smoking 12/19/2013  . Depression 12/19/2013    Past Surgical History:  Procedure Laterality Date  . ABDOMINAL HYSTERECTOMY    . BACK SURGERY    . CHOLECYSTECTOMY      Prior to Admission medications   Medication Sig Start Date End Date Taking? Authorizing Provider  acetaminophen-codeine (TYLENOL #3) 300-30 MG tablet Take 1 tablet by mouth every 8 (eight) hours as needed for moderate pain. 08/18/15   Josalyn  Funches, MD  atorvastatin (LIPITOR) 40 MG tablet Take 1 tablet (40 mg total) by mouth daily. 02/09/15   Josalyn Funches, MD  Blood Glucose Monitoring Suppl (TRUE METRIX METER) w/Device KIT USE AS DIRECTED 12/02/15   Boykin Nearing, MD  cholecalciferol (VITAMIN D) 1000 units tablet Take 1,000 Units by mouth daily.    Historical Provider, MD  DULoxetine (CYMBALTA) 60 MG capsule Take 1 capsule (60 mg total) by mouth daily. 10/01/15   Josalyn Funches, MD  gemfibrozil (LOPID) 600 MG tablet TAKE 1 TABLET BY MOUTH 2 TIMES DAILY BEFORE A MEAL Patient not taking: Reported on 11/23/2015 06/19/15   Boykin Nearing, MD  glimepiride (AMARYL) 4 MG tablet Take 1 tablet (4 mg total) by mouth daily before breakfast. 10/01/15   Adriana Mccallum Funches, MD  glucose blood (TRUE METRIX BLOOD GLUCOSE TEST) test strip 1 each by Other route 3 (three) times daily. 07/09/15   Josalyn Funches, MD  hydrOXYzine (ATARAX/VISTARIL) 10 MG tablet Take 1 tablet (10 mg total) by mouth 3 (three) times daily as needed. 11/23/15   Josalyn Funches, MD  insulin aspart protamine- aspart (NOVOLOG MIX 70/30) (70-30) 100 UNIT/ML injection Inject 0.35 mLs (35 Units total) into the skin 2 (two) times daily with a meal. 10/01/15   Josalyn Funches, MD  methocarbamol (ROBAXIN-750) 750 MG tablet Take 1 tablet (750 mg total) by mouth every 8 (eight) hours as needed for muscle spasms. 04/02/15   Boykin Nearing, MD  Multiple Vitamins-Minerals (MULTIVITAL) tablet Take 1 tablet by mouth daily.    Historical Provider, MD  naproxen (NAPROSYN) 500 MG tablet Take 1 tablet (500  mg total) by mouth 2 (two) times daily with a meal. 10/08/15   Boykin Nearing, MD  omega-3 acid ethyl esters (LOVAZA) 1 g capsule Take 1 capsule (1 g total) by mouth 2 (two) times daily. 06/29/15   Josalyn Funches, MD  ramipril (ALTACE) 2.5 MG capsule TAKE 1 CAPSULE BY MOUTH ONCE DAILY 03/12/15   Josalyn Funches, MD  ranitidine (ZANTAC) 150 MG tablet TAKE 2 TABLET BY MOUTH AT BEDTIME. 12/02/15   Josalyn  Funches, MD  sitaGLIPtin (JANUVIA) 100 MG tablet Take 1 tablet (100 mg total) by mouth daily. 08/19/15   Josalyn Funches, MD  sodium chloride (OCEAN) 0.65 % SOLN nasal spray Place 1 spray into both nostrils as needed for congestion. 08/15/14   Lorayne Marek, MD  traZODone (DESYREL) 50 MG tablet Take 0.5-1 tablets (25-50 mg total) by mouth at bedtime as needed for sleep. 11/23/15   Boykin Nearing, MD    Allergies Fentanyl; Metformin and related; and Phenergan [promethazine hcl]  Family History  Problem Relation Age of Onset  . Diabetes Mother   . Heart disease Mother   . Hypertension Father   . Heart disease Father   . Cancer Paternal Aunt     lung cancer  . Cancer Maternal Grandmother     breast cancer  . Diabetes Maternal Grandfather     Social History Social History  Substance Use Topics  . Smoking status: Current Every Day Smoker    Packs/day: 1.00    Years: 35.00    Types: Cigarettes    Start date: 03/27/2014  . Smokeless tobacco: Never Used  . Alcohol use No     Review of Systems  Constitutional: No fever/chills Cardiovascular: no chest pain. Respiratory: no cough. No SOB. Musculoskeletal: Negative for musculoskeletal pain. Skin: Positive for laceration to the right hand Neurological: Negative for headaches, focal weakness or numbness. 10-point ROS otherwise negative.  ____________________________________________   PHYSICAL EXAM:  VITAL SIGNS: ED Triage Vitals  Enc Vitals Group     BP 12/26/15 1750 (!) 150/76     Pulse Rate 12/26/15 1750 100     Resp 12/26/15 1750 18     Temp 12/26/15 1750 98.4 F (36.9 C)     Temp Source 12/26/15 1750 Oral     SpO2 12/26/15 1750 98 %     Weight 12/26/15 1750 195 lb (88.5 kg)     Height 12/26/15 1750 5' 2.5" (1.588 m)     Head Circumference --      Peak Flow --      Pain Score 12/26/15 1751 8     Pain Loc --      Pain Edu? --      Excl. in [place]? --      Constitutional: Alert and oriented. Well appearing and in no  acute distress. Eyes: Conjunctivae are normal. PERRL. EOMI. Head: Atraumatic. Cardiovascular: Normal rate, regular rhythm. Normal S1 and S2.  Good peripheral circulation. Respiratory: Normal respiratory effort without tachypnea or retractions. Lungs CTAB. Good air entry to the bases with no decreased or absent breath sounds. Musculoskeletal: Full range of motion to all extremities. No gross deformities appreciated. Neurologic:  Normal speech and language. No gross focal neurologic deficits are appreciated.  Skin:  Skin is warm, dry and intact. No rash noted. Small laceration noted to the right thenar eminence. This is approximately 1 cm in length. Area is superficial in nature. No bleeding. No foreign body. Full range of motion all digits right hand. Sensation and cap refill intact  5 digits. Psychiatric: Mood and affect are normal. Speech and behavior are normal. Patient exhibits appropriate insight and judgement.   ____________________________________________   LABS (all labs ordered are listed, but only abnormal results are displayed)  Labs Reviewed - No data to display ____________________________________________  EKG   ____________________________________________  RADIOLOGY   No results found.  ____________________________________________    PROCEDURES  Procedure(s) performed:    Marland KitchenMarland KitchenLaceration Repair Date/Time: 12/26/2015 8:02 PM Performed by: Betha Loa D Authorized by: Betha Loa D   Consent:    Consent obtained:  Verbal   Consent given by:  Patient   Risks discussed:  Need for additional repair Anesthesia (see MAR for exact dosages):    Anesthesia method:  None Laceration details:    Location:  Hand   Hand location:  R palm   Length (cm):  1 Repair type:    Repair type:  Simple Pre-procedure details:    Preparation:  Patient was prepped and draped in usual sterile fashion Exploration:    Hemostasis achieved with:  Direct pressure    Wound exploration: entire depth of wound probed and visualized     Contaminated: no   Treatment:    Area cleansed with:  Shur-Clens   Amount of cleaning:  Standard Skin repair:    Repair method:  Tissue adhesive Approximation:    Approximation:  Close Post-procedure details:    Dressing:  Open (no dressing)       Medications  Tdap (BOOSTRIX) injection 0.5 mL (0.5 mLs Intramuscular Given 12/26/15 1920)  ibuprofen (ADVIL,MOTRIN) tablet 800 mg (800 mg Oral Given 12/26/15 1932)     ____________________________________________   INITIAL IMPRESSION / ASSESSMENT AND PLAN / ED COURSE  Pertinent labs & imaging results that were available during my care of the patient were reviewed by me and considered in my medical decision making (see chart for details).  Review of the [place] CSRS was performed in accordance of the [place] prior to dispensing any controlled drugs.  Clinical Course    Patient's diagnosis is consistent with Right hand laceration. This closed as described above. No cough medications. Wound care instructions are given. Patient is take at home medications for pain relief..  Patient is given ED precautions to return to the ED for any worsening or new symptoms.     ____________________________________________  FINAL CLINICAL IMPRESSION(S) / ED DIAGNOSES  Final diagnoses:  Hand laceration, right, initial encounter      NEW MEDICATIONS STARTED DURING THIS VISIT:  Discharge Medication List as of 12/26/2015  7:29 PM          This chart was dictated using voice recognition software/Dragon. Despite best efforts to proofread, errors can occur which can change the meaning. Any change was purely unintentional.    Darletta Moll, PA-C 12/26/15 2014    Hinda Kehr, MD 12/26/15 2135

## 2015-12-26 NOTE — ED Notes (Signed)
Laceration noted to pt's right palm just below thumb, about 1", curved, bleeding controlled.

## 2016-01-29 MED FILL — ATORVASTATIN 40 MG TABLET: 40 | 30 days supply | Qty: 30 | Fill #6

## 2016-01-29 MED FILL — hydrOXYzine HCL 10 MG TABS: 10 | 10 days supply | Qty: 30 | Fill #1

## 2016-01-29 MED FILL — RAMIPRIL 2.5 MG CAPSULE: 2.5 | 30 days supply | Qty: 30 | Fill #5

## 2016-01-29 MED FILL — raNITIdine HCL 150 MG TABS: 150 | 30 days supply | Qty: 60 | Fill #1

## 2016-01-29 MED FILL — ACETAMINOPHEN/COD #3 TABLET: 300-30 | 20 days supply | Qty: 60 | Fill #2

## 2016-01-29 MED FILL — OMEGA-3 ETHYL ESTERS 1 GM C: 1 | 30 days supply | Qty: 60 | Fill #4

## 2016-01-29 MED FILL — ?GLIMEPIRIDE 4 MG TABLET: 4 | 30 days supply | Qty: 30 | Fill #2

## 2016-01-29 MED FILL — DULoxetine HCL 60 MG CPEP: 60 | 30 days supply | Qty: 30 | Fill #3

## 2016-01-29 MED FILL — **JANUVIA 100 MG TABLET: 100 | 28 days supply | Qty: 28 | Fill #6

## 2016-01-29 MED FILL — $NOVOLOG MIX 70/30 VIAL: (70-30) 100 | 28 days supply | Qty: 20 | Fill #1

## 2016-01-29 MED FILL — traZODone HCL 50 MG TABS: 50 | 30 days supply | Qty: 30 | Fill #1

## 2016-03-10 ENCOUNTER — Other Ambulatory Visit: Payer: Self-pay | Admitting: Family Medicine

## 2016-03-10 DIAGNOSIS — E1165 Type 2 diabetes mellitus with hyperglycemia: Secondary | ICD-10-CM

## 2016-03-10 DIAGNOSIS — E781 Pure hyperglyceridemia: Secondary | ICD-10-CM

## 2016-03-10 DIAGNOSIS — IMO0001 Reserved for inherently not codable concepts without codable children: Secondary | ICD-10-CM

## 2016-03-10 MED FILL — hydrOXYzine HCL 10 MG TABS: 10 | 10 days supply | Qty: 30 | Fill #2

## 2016-03-10 MED FILL — raNITIdine HCL 150 MG TABS: 150 | 30 days supply | Qty: 60 | Fill #2

## 2016-03-10 MED FILL — DULoxetine HCL 60 MG CPEP: 60 | 30 days supply | Qty: 30 | Fill #4

## 2016-03-10 MED FILL — RAMIPRIL 2.5 MG CAPSULE: 2.5 | 30 days supply | Qty: 30 | Fill #6

## 2016-03-10 MED FILL — ?GLIMEPIRIDE 4 MG TABLET: 4 | 30 days supply | Qty: 30 | Fill #3

## 2016-03-10 MED FILL — $NOVOLOG MIX 70/30 VIAL: (70-30) 100 | 28 days supply | Qty: 20 | Fill #2

## 2016-03-10 MED FILL — traZODone HCL 50 MG TABS: 50 | 30 days supply | Qty: 30 | Fill #2

## 2016-03-11 MED FILL — **JANUVIA 100 MG TABLET: 100 | 30 days supply | Qty: 30 | Fill #0

## 2016-03-11 MED FILL — ATORVASTATIN 40 MG TABLET: 40 | 30 days supply | Qty: 30 | Fill #0

## 2016-03-15 MED FILL — TRUE METRIX TEST STRIP: 33 days supply | Qty: 100 | Fill #1

## 2016-03-15 MED FILL — OMEGA-3 ETHYL ESTERS 1 GM C: 1 | 30 days supply | Qty: 60 | Fill #0

## 2016-03-17 ENCOUNTER — Emergency Department: Payer: Self-pay

## 2016-03-17 ENCOUNTER — Encounter: Payer: Self-pay | Admitting: *Deleted

## 2016-03-17 ENCOUNTER — Emergency Department
Admission: EM | Admit: 2016-03-17 | Discharge: 2016-03-17 | Disposition: A | Discharge status: Home / Self Care | Payer: Self-pay | Attending: Emergency Medicine | Admitting: Emergency Medicine

## 2016-03-17 DIAGNOSIS — F17210 Nicotine dependence, cigarettes, uncomplicated: Secondary | ICD-10-CM | POA: Insufficient documentation

## 2016-03-17 DIAGNOSIS — Z79899 Other long term (current) drug therapy: Secondary | ICD-10-CM | POA: Insufficient documentation

## 2016-03-17 DIAGNOSIS — Z794 Long term (current) use of insulin: Secondary | ICD-10-CM | POA: Insufficient documentation

## 2016-03-17 DIAGNOSIS — I10 Essential (primary) hypertension: Secondary | ICD-10-CM | POA: Insufficient documentation

## 2016-03-17 DIAGNOSIS — M65842 Other synovitis and tenosynovitis, left hand: Secondary | ICD-10-CM | POA: Insufficient documentation

## 2016-03-17 DIAGNOSIS — M659 Synovitis and tenosynovitis, unspecified: Secondary | ICD-10-CM

## 2016-03-17 DIAGNOSIS — E119 Type 2 diabetes mellitus without complications: Secondary | ICD-10-CM | POA: Insufficient documentation

## 2016-03-17 MED ORDER — KETOROLAC TROMETHAMINE 30 MG/ML IJ SOLN
30.0000 mg | Freq: Once | INTRAMUSCULAR | Status: AC
  Administered 2016-03-17: 30 mg via INTRAMUSCULAR
  Filled 2016-03-17: qty 1

## 2016-03-17 MED ORDER — MELOXICAM 15 MG PO TABS: 15.0000 mg | ORAL_TABLET | Freq: Every day | ORAL | 0 refills | Status: DC

## 2016-03-17 NOTE — ED Provider Notes (Signed)
[facility] Emergency Department Provider Note  ____________________________________________  Time seen: Approximately 6:02 PM  I have reviewed the triage vital signs and the nursing notes.   HISTORY  Chief Complaint Hand Pain    HPI Sara Vasquez is a 50 y.o. female , NAD, presents to the emergency with 3 day history of left thumb pain. Patient states she had pain about the base of her left thumb approximately 3 days ago. Pulled on her thumb to "crack the finger" as she is done in the past and notes that worsen the pain. Has now noted swelling and pain extending into the left lateral wrist. Denies any injuries, falls or blood traumas to the area. Has not noted any abnormal warmth, skin sores or rashes. Has not had any numbness, weakness or tingling. Has retained full range of motion of the left wrist, hand and fingers since the pain began.   Past Medical History:  Diagnosis Date  . Anxiety   . Arthritis   . Depression   . Diabetes mellitus without complication ([place])   . Hypertension     Patient Active Problem List   Diagnosis Date Noted  . Homelessness 11/23/2015  . Toe injury 11/23/2015  . Other fatigue 11/23/2015  . Esophageal reflux 10/01/2015  . Bilateral hand swelling 10/01/2015  . Chronic low back pain 07/09/2015  . Elevated triglycerides with high cholesterol 06/19/2015  . Traumatic myalgia 04/02/2015  . Acute posttraumatic stress disorder 04/02/2015  . Diabetes type 2, uncontrolled ([place]) 02/04/2015  . Tendinopathy of right rotator cuff 02/04/2015  . Vitamin D deficiency 03/19/2014  . Abnormal LFTs 03/19/2014  . Hyperlipidemia associated with type 2 diabetes mellitus ([place]) 03/19/2014  . Essential hypertension, benign 12/19/2013  . Smoking 12/19/2013  . Depression 12/19/2013    Past Surgical History:  Procedure Laterality Date  . ABDOMINAL HYSTERECTOMY    . BACK SURGERY    . CHOLECYSTECTOMY      Prior to Admission medications    Medication Sig Start Date End Date Taking? Authorizing Provider  acetaminophen-codeine (TYLENOL #3) 300-30 MG tablet Take 1 tablet by mouth every 8 (eight) hours as needed for moderate pain. 08/18/15   Josalyn Funches, MD  atorvastatin (LIPITOR) 40 MG tablet TAKE 1 TABLET (40 MG TOTAL) BY MOUTH DAILY. 03/11/16   Josalyn Funches, MD  Blood Glucose Monitoring Suppl (TRUE METRIX METER) w/Device KIT USE AS DIRECTED 12/02/15   Boykin Nearing, MD  cholecalciferol (VITAMIN D) 1000 units tablet Take 1,000 Units by mouth daily.    Historical Provider, MD  DULoxetine (CYMBALTA) 60 MG capsule Take 1 capsule (60 mg total) by mouth daily. 10/01/15   Josalyn Funches, MD  gemfibrozil (LOPID) 600 MG tablet TAKE 1 TABLET BY MOUTH 2 TIMES DAILY BEFORE A MEAL Patient not taking: Reported on 11/23/2015 06/19/15   Boykin Nearing, MD  glimepiride (AMARYL) 4 MG tablet Take 1 tablet (4 mg total) by mouth daily before breakfast. 10/01/15   Adriana Mccallum Funches, MD  glucose blood (TRUE METRIX BLOOD GLUCOSE TEST) test strip 1 each by Other route 3 (three) times daily. 07/09/15   Josalyn Funches, MD  hydrOXYzine (ATARAX/VISTARIL) 10 MG tablet Take 1 tablet (10 mg total) by mouth 3 (three) times daily as needed. 11/23/15   Josalyn Funches, MD  insulin aspart protamine- aspart (NOVOLOG MIX 70/30) (70-30) 100 UNIT/ML injection Inject 0.35 mLs (35 Units total) into the skin 2 (two) times daily with a meal. 10/01/15   Josalyn Funches, MD  JANUVIA 100 MG tablet TAKE  1 TABLET BY MOUTH DAILY 03/11/16   Boykin Nearing, MD  meloxicam (MOBIC) 15 MG tablet Take 1 tablet (15 mg total) by mouth daily. 03/17/16   Katheren Jimmerson L Nyashia Raney, PA-C  methocarbamol (ROBAXIN-750) 750 MG tablet Take 1 tablet (750 mg total) by mouth every 8 (eight) hours as needed for muscle spasms. 04/02/15   Boykin Nearing, MD  Multiple Vitamins-Minerals (MULTIVITAL) tablet Take 1 tablet by mouth daily.    Historical Provider, MD  naproxen (NAPROSYN) 500 MG tablet Take 1 tablet (500 mg  total) by mouth 2 (two) times daily with a meal. 10/08/15   Josalyn Funches, MD  omega-3 acid ethyl esters (LOVAZA) 1 g capsule TAKE 1 CAPSULE BY MOUTH 2 TIMES DAILY 03/11/16   Josalyn Funches, MD  ramipril (ALTACE) 2.5 MG capsule TAKE 1 CAPSULE BY MOUTH ONCE DAILY 03/12/15   Josalyn Funches, MD  ranitidine (ZANTAC) 150 MG tablet TAKE 2 TABLET BY MOUTH AT BEDTIME. 12/02/15   Josalyn Funches, MD  sodium chloride (OCEAN) 0.65 % SOLN nasal spray Place 1 spray into both nostrils as needed for congestion. 08/15/14   Lorayne Marek, MD  traZODone (DESYREL) 50 MG tablet Take 0.5-1 tablets (25-50 mg total) by mouth at bedtime as needed for sleep. 11/23/15   Boykin Nearing, MD    Allergies Fentanyl; Metformin and related; and Phenergan [promethazine hcl]  Family History  Problem Relation Age of Onset  . Diabetes Mother   . Heart disease Mother   . Hypertension Father   . Heart disease Father   . Cancer Paternal Aunt     lung cancer  . Cancer Maternal Grandmother     breast cancer  . Diabetes Maternal Grandfather     Social History Social History  Substance Use Topics  . Smoking status: Current Every Day Smoker    Packs/day: 1.00    Years: 35.00    Types: Cigarettes    Start date: 03/27/2014  . Smokeless tobacco: Never Used  . Alcohol use No     Review of Systems  Constitutional: No fever/chills Musculoskeletal: Positive left thumb and lateral wrist pain. Skin: Positive swelling lateral wrist. Negative for rash, redness, skin sores, bruising. Neurological: Negative for numbness, weakness, tingling.   ____________________________________________   PHYSICAL EXAM:  VITAL SIGNS: ED Triage Vitals  Enc Vitals Group     BP 03/17/16 1747 135/72     Pulse Rate 03/17/16 1747 78     Resp 03/17/16 1747 18     Temp 03/17/16 1747 97.9 F (36.6 C)     Temp Source 03/17/16 1747 Oral     SpO2 03/17/16 1747 96 %     Weight 03/17/16 1748 190 lb (86.2 kg)     Height 03/17/16 1748 '5\' 2"'$  (1.575  m)     Head Circumference --      Peak Flow --      Pain Score 03/17/16 1748 6     Pain Loc --      Pain Edu? --      Excl. in [place]? --      Constitutional: Alert and oriented. Well appearing and in no acute distress. Eyes: Conjunctivae are normal.  Head: Atraumatic. Cardiovascular:  Good peripheral circulation with 2+ pulses noted in the left upper extremity. Capillary refill is brisk in all digits of the left hand. Respiratory: Normal respiratory effort without tachypnea or retractions.  Musculoskeletal: Tenderness to palpation about the left lateral base of the thumb without crepitus or bony abnormality. Full range of motion of  the left wrist, hand and fingers without difficulty but some pain about the left lateral wrist with full flexion and extension of the wrist. Grip strength in bilateral upper extremities is 5 out of 5 and equal. Neurologic:  Normal speech and language. No gross focal neurologic deficits are appreciated. Sensation light touch grossly intact to the left upper extremity. Skin:  Very mild swelling noted about the left lateral wrist without cellulitis, skin sores, bruising. Skin is warm, dry and intact. No rash noted. Psychiatric: Mood and affect are normal. Speech and behavior are normal. Patient exhibits appropriate insight and judgement.   ____________________________________________   LABS  None ____________________________________________  EKG  None ____________________________________________  RADIOLOGY I, Braxton Feathers, personally viewed and evaluated these images (plain radiographs) as part of my medical decision making, as well as reviewing the written report by the radiologist.  Dg Hand Complete Left  Result Date: 03/17/2016 CLINICAL DATA:  Pain along the first metacarpal for 1 day. No known injury. Initial encounter. EXAM: LEFT HAND - COMPLETE 3+ VIEW COMPARISON:  None. FINDINGS: There is no evidence of fracture or dislocation. There is no evidence  of arthropathy or other focal bone abnormality. Soft tissues are unremarkable. IMPRESSION: Negative. Electronically Signed   By: Margarette Canada M.D.   On: 03/17/2016 18:39    ____________________________________________    PROCEDURES  Procedure(s) performed: None   Procedures   Medications  ketorolac (TORADOL) 30 MG/ML injection 30 mg (30 mg Intramuscular Given 03/17/16 1809)     ____________________________________________   INITIAL IMPRESSION / ASSESSMENT AND PLAN / ED COURSE  Pertinent labs & imaging results that were available during my care of the patient were reviewed by me and considered in my medical decision making (see chart for details).  Clinical Course     Patient's diagnosis is consistent with Tenosynovitis of the left thumb. Patient was given Toradol injection in the emergency department and tolerated well. Wrist splint was applied. Patient will be discharged home with prescriptions for meloxicam to take as directed. Patient is to follow up with Dr. Sabra Heck orthopedics if symptoms persist past this treatment course. Patient is given ED precautions to return to the ED for any worsening or new symptoms.    ____________________________________________  FINAL CLINICAL IMPRESSION(S) / ED DIAGNOSES  Final diagnoses:  Tenosynovitis of thumb      NEW MEDICATIONS STARTED DURING THIS VISIT:  Discharge Medication List as of 03/17/2016  6:52 PM    START taking these medications   Details  meloxicam (MOBIC) 15 MG tablet Take 1 tablet (15 mg total) by mouth daily., Starting Thu 03/17/2016, [place], PA-C 03/17/16 [place], MD 03/18/16 0001

## 2016-03-17 NOTE — ED Triage Notes (Signed)
Patient c/o left thumb pain that began yesterday. Patient states pain now radiates to the left wrist.

## 2016-03-29 ENCOUNTER — Ambulatory Visit: Payer: Self-pay | Attending: Family Medicine | Admitting: Family Medicine

## 2016-03-29 ENCOUNTER — Encounter: Payer: Self-pay | Admitting: Licensed Clinical Social Worker

## 2016-03-29 ENCOUNTER — Encounter: Payer: Self-pay | Admitting: Family Medicine

## 2016-03-29 VITALS — BP 134/86 | HR 76 | Temp 98.4°F | Ht 62.5 in | Wt 191.0 lb

## 2016-03-29 DIAGNOSIS — IMO0001 Reserved for inherently not codable concepts without codable children: Secondary | ICD-10-CM

## 2016-03-29 DIAGNOSIS — M778 Other enthesopathies, not elsewhere classified: Secondary | ICD-10-CM | POA: Insufficient documentation

## 2016-03-29 DIAGNOSIS — E1165 Type 2 diabetes mellitus with hyperglycemia: Secondary | ICD-10-CM | POA: Insufficient documentation

## 2016-03-29 DIAGNOSIS — Z23 Encounter for immunization: Secondary | ICD-10-CM | POA: Insufficient documentation

## 2016-03-29 DIAGNOSIS — F3341 Major depressive disorder, recurrent, in partial remission: Secondary | ICD-10-CM | POA: Insufficient documentation

## 2016-03-29 DIAGNOSIS — I10 Essential (primary) hypertension: Secondary | ICD-10-CM | POA: Insufficient documentation

## 2016-03-29 DIAGNOSIS — Z5900 Homelessness unspecified: Secondary | ICD-10-CM

## 2016-03-29 DIAGNOSIS — Z0001 Encounter for general adult medical examination with abnormal findings: Secondary | ICD-10-CM | POA: Insufficient documentation

## 2016-03-29 DIAGNOSIS — F324 Major depressive disorder, single episode, in partial remission: Secondary | ICD-10-CM

## 2016-03-29 DIAGNOSIS — Z79899 Other long term (current) drug therapy: Secondary | ICD-10-CM | POA: Insufficient documentation

## 2016-03-29 DIAGNOSIS — F17210 Nicotine dependence, cigarettes, uncomplicated: Secondary | ICD-10-CM | POA: Insufficient documentation

## 2016-03-29 LAB — GLUCOSE, POCT (MANUAL RESULT ENTRY): POC GLUCOSE: 176 mg/dL — AB (ref 70–99)

## 2016-03-29 LAB — POCT GLYCOSYLATED HEMOGLOBIN (HGB A1C): HEMOGLOBIN A1C: 8.9

## 2016-03-29 MED ORDER — TRAZODONE HCL 100 MG PO TABS: 100.0000 mg | ORAL_TABLET | Freq: Every evening | ORAL | 3 refills | Status: DC | PRN

## 2016-03-29 MED ORDER — INSULIN ASPART PROT & ASPART (70-30 MIX) 100 UNIT/ML ~~LOC~~ SUSP: 40.0000 [IU] | Freq: Two times a day (BID) | SUBCUTANEOUS | 3 refills | Status: DC

## 2016-03-29 NOTE — Progress Notes (Signed)
Subjective:  Patient ID: Sara Vasquez, female    DOB: 1965-06-01  Age: 50 y.o. MRN: [id number]  CC: Diabetes   HPI Sara Vasquez presents for   1. CHRONIC DIABETES  Disease Monitoring  Blood Sugar Ranges:   Post prandial: 190-250  Polyuria: no   Visual problems: no   Medication Compliance: yes  Medication Side Effects  Hypoglycemia: no   2. CHRONIC HYPERTENSION  Disease Monitoring  Blood pressure range: not checking   Chest pain: no   Dyspnea: no   Claudication: no   Medication compliance: yes  Medication Side Effects  Lightheadedness: no   Urinary frequency: no   Edema: no    3. Homelessness: living in her car for the past 3 months. Has friends and family who allow her to come over and eat. She is separated from her husband. She is applying for disability.    Social History  Substance Use Topics  . Smoking status: Current Every Day Smoker    Packs/day: 1.00    Years: 35.00    Types: Cigarettes    Start date: 03/27/2014  . Smokeless tobacco: Never Used  . Alcohol use No    Social History  Substance Use Topics  . Smoking status: Current Every Day Smoker    Packs/day: 1.00    Years: 35.00    Types: Cigarettes    Start date: 03/27/2014  . Smokeless tobacco: Never Used  . Alcohol use No    Outpatient Medications Prior to Visit  Medication Sig Dispense Refill  . acetaminophen-codeine (TYLENOL #3) 300-30 MG tablet Take 1 tablet by mouth every 8 (eight) hours as needed for moderate pain. 60 tablet 2  . atorvastatin (LIPITOR) 40 MG tablet TAKE 1 TABLET (40 MG TOTAL) BY MOUTH DAILY. 30 tablet 2  . Blood Glucose Monitoring Suppl (TRUE METRIX METER) w/Device KIT USE AS DIRECTED 1 kit 0  . cholecalciferol (VITAMIN D) 1000 units tablet Take 1,000 Units by mouth daily.    . DULoxetine (CYMBALTA) 60 MG capsule Take 1 capsule (60 mg total) by mouth daily. 30 capsule 5  . gemfibrozil (LOPID) 600 MG tablet TAKE 1 TABLET BY MOUTH 2 TIMES DAILY BEFORE A MEAL (Patient  not taking: Reported on 11/23/2015) 60 tablet 5  . glimepiride (AMARYL) 4 MG tablet Take 1 tablet (4 mg total) by mouth daily before breakfast. 30 tablet 5  . glucose blood (TRUE METRIX BLOOD GLUCOSE TEST) test strip 1 each by Other route 3 (three) times daily. 100 each 11  . hydrOXYzine (ATARAX/VISTARIL) 10 MG tablet Take 1 tablet (10 mg total) by mouth 3 (three) times daily as needed. 30 tablet 5  . insulin aspart protamine- aspart (NOVOLOG MIX 70/30) (70-30) 100 UNIT/ML injection Inject 0.35 mLs (35 Units total) into the skin 2 (two) times daily with a meal. 50 mL 3  . JANUVIA 100 MG tablet TAKE 1 TABLET BY MOUTH DAILY 30 tablet 0  . meloxicam (MOBIC) 15 MG tablet Take 1 tablet (15 mg total) by mouth daily. 30 tablet 0  . methocarbamol (ROBAXIN-750) 750 MG tablet Take 1 tablet (750 mg total) by mouth every 8 (eight) hours as needed for muscle spasms. 30 tablet 0  . Multiple Vitamins-Minerals (MULTIVITAL) tablet Take 1 tablet by mouth daily.    . naproxen (NAPROSYN) 500 MG tablet Take 1 tablet (500 mg total) by mouth 2 (two) times daily with a meal. 60 tablet 3  . omega-3 acid ethyl esters (LOVAZA) 1 g capsule TAKE 1  CAPSULE BY MOUTH 2 TIMES DAILY 60 capsule 2  . ramipril (ALTACE) 2.5 MG capsule TAKE 1 CAPSULE BY MOUTH ONCE DAILY 90 capsule 3  . ranitidine (ZANTAC) 150 MG tablet TAKE 2 TABLET BY MOUTH AT BEDTIME. 60 tablet 2  . sodium chloride (OCEAN) 0.65 % SOLN nasal spray Place 1 spray into both nostrils as needed for congestion. 15 mL 3  . traZODone (DESYREL) 50 MG tablet Take 0.5-1 tablets (25-50 mg total) by mouth at bedtime as needed for sleep. 30 tablet 3   No facility-administered medications prior to visit.     ROS Review of Systems  Constitutional: Positive for fatigue. Negative for chills and fever.  Eyes: Negative for visual disturbance.  Respiratory: Negative for shortness of breath.   Cardiovascular: Negative for chest pain.  Gastrointestinal: Negative for abdominal pain and  blood in stool.  Musculoskeletal: Positive for arthralgias, back pain, gait problem and joint swelling. Negative for myalgias, neck pain and neck stiffness.  Skin: Negative for rash.  Allergic/Immunologic: Negative for immunocompromised state.  Hematological: Negative for adenopathy. Does not bruise/bleed easily.  Psychiatric/Behavioral: Positive for dysphoric mood and sleep disturbance. Negative for suicidal ideas. The patient is nervous/anxious.     Objective:  BP 134/86 (BP Location: Left Arm, Patient Position: Sitting, Cuff Size: Small)   Pulse 76   Temp 98.4 F (36.9 C) (Oral)   Ht 5' 2.5" (1.588 m)   Wt 191 lb (86.6 kg)   SpO2 99%   BMI 34.38 kg/m   BP/Weight 03/29/2016 95/12/3265 04/26/4578  Systolic BP 998 338 250  Diastolic BP 86 77 76  Wt. (Lbs) 191 190 195  BMI 34.38 34.75 35.1    Physical Exam  Constitutional: She is oriented to person, place, and time. She appears well-developed and well-nourished. No distress.  HENT:  Head: Normocephalic and atraumatic.  Cardiovascular: Normal rate, regular rhythm, normal heart sounds and intact distal pulses.   Pulmonary/Chest: Effort normal and breath sounds normal.  Musculoskeletal: She exhibits no edema.       Right hand: She exhibits decreased range of motion and swelling (mild ). She exhibits no tenderness, normal two-point discrimination, normal capillary refill, no deformity and no laceration. Normal sensation noted. Normal strength noted.       Left hand: She exhibits decreased range of motion and swelling. She exhibits no tenderness, no bony tenderness, normal capillary refill, no deformity and no laceration. Normal sensation noted. Normal strength noted.     Neurological: She is alert and oriented to person, place, and time.  Skin: Skin is warm and dry. No rash noted.  Psychiatric: She has a normal mood and affect.   Lab Results  Component Value Date   HGBA1C 8.3 10/01/2015   Lab Results  Component Value Date    HGBA1C 8.9 03/29/2016    CBG 176 Depression screen [facility] 2/9 03/29/2016 11/23/2015 10/01/2015 08/18/2015 07/09/2015  Decreased Interest _0 Down, Depressed, Hopeless _1 PHQ - 2 Score _2 Altered sleeping _3 Tired, decreased energy _4 Change in appetite _5 Feeling bad or failure about yourself  0 0 1 1 0  Trouble concentrating 0 _6 Moving slowly or fidgety/restless _7 0  Suicidal thoughts 0 0 0 0 0  PHQ-9 Score _8 GAD 7 : Generalized  Anxiety Score 03/29/2016 11/23/2015 10/01/2015 08/18/2015  Nervous, Anxious, on Edge _0 Control/stop worrying _1 Worry too much - different things _2 Trouble relaxing _3 Restless _4 Easily annoyed or irritable _5 Afraid - awful might happen _6 Total GAD 7 Score _7 Assessment & Plan:  Sara Vasquez was seen today for diabetes and hypertension.  Diagnoses and all orders for this visit:  Uncontrolled diabetes mellitus type 2 without complications, unspecified long term insulin use status (HCC) -     POCT glucose (manual entry) -     POCT glycosylated hemoglobin (Hb A1C)  Essential hypertension, benign  Major depressive disorder with single episode, in partial remission (HCC)  Recurrent major depressive disorder, in partial remission ([place]) -     traZODone (DESYREL) 100 MG tablet; Take 1 tablet (100 mg total) by mouth at bedtime as needed for sleep.  Uncontrolled type 2 diabetes mellitus without complication, without long-term current use of insulin (HCC) -     insulin aspart protamine- aspart (NOVOLOG MIX 70/30) (70-30) 100 UNIT/ML injection; Inject 0.4 mLs (40 Units total) into the skin 2 (two) times daily with a meal.  Left wrist tendinitis  Homelessness  Encounter for immunization -     Flu Vaccine QUAD 36+ mos IM   There are no diagnoses linked to this encounter.  No orders of the defined types were placed in this  encounter.   Follow-up: Return in about 6 weeks (around 05/10/2016) for diabetes .   Boykin Nearing MD

## 2016-03-29 NOTE — BH Specialist Note (Signed)
Session Start time: 9:30 am   End Time: 9:55 pm Total Time:  25 minutes Type of Service: Behavioral Health - Individual/Family Interpreter: No.   Interpreter Name & Language: N/A # [facility]   SUBJECTIVE: Sara PayerWendy W Vasquez is a 50 y.o. female  Pt. was referred by Dr. Armen PickupFunches for:  housing resources. Pt. reports the following symptoms/concerns: nervousness, inability to be near crowds, excessive worry, panic attacks, racing thoughts, and difficulty sleeping Duration of problem:  Pt reported that she was diagnosed with depression and anxiety approx 25 years ago.  Severity: mild Previous treatment: Pt takes prescribed medication. No reported history of psychotherapy   OBJECTIVE: Mood: Anxious & Affect: Appropriate Risk of harm to self or others: Pt denied SI/HI Assessments administered: PHQ-9; GAD-7  LIFE CONTEXT:  Family & Social: Pt receives support from mother and spouse. Pt reports she and spouse are separated but good friends School/ Work: Pt has applied for disability, hearing was completed on November 21, 17 Self-Care: Pt smokes cigarettes (1 pack daily). Denied any additional substance use Life changes: Pt is currently homeless and sleeps in car.  What is important to pt/family (values): Family   GOALS ADDRESSED:  Decrease symptoms of depression Decrease symptoms of anxiety    INTERVENTIONS: Solution Focused, Strength-based and Supportive   ASSESSMENT:  Pt currently experiencing depression and anxiety. Pt reports nervousness, inability to be near crowds, excessive worry, panic attacks, racing thoughts, and difficulty sleeping. Pt is currently homeless and has limited support. Pt may benefit from psychotherapy and medication management. LCSWA educated pt on the cycle of depression and anxiety and discussed healthy coping skills to decrease symptoms. Pt is compliant with medication management and identified multiple healthy strategies to utilize on a  weekly basis to cope with stressors. Pt verbalized interest in initiating therapy and was provided with community resources for behavioral health and crisis interventions. LCSWA provided information on agencies that assist with providing documentation of homelessness, in addition, to emergency housing resources. Pt was encouraged to schedule an appointment with Financial Counseling to assist with financial difficulties and obtain case management services.      PLAN: 1. F/U with behavioral health clinician: Pt was encouraged to contact LCSWA if symptoms worsen or fail to improve [facility] 2. Behavioral Health meds: Cymbalta, Desyrel 3. Behavioral recommendations: LCSWA recommends that pt apply healthy coping skills discussed. Pt is encouraged to schedule follow up appointment with LCSWA and appointment with Financial Counseling 4. Referral: Brief Counseling/Psychotherapy, [facility], Problem-solving teaching/coping strategies, Psychoeducation and Supportive Counseling 5. From scale of 1-10, how likely are you to follow plan: 7/10   Sara LarssonJasmine D Vasquez, MSW, LCSWA  Clinical Social Worker 03/29/16 12:41 pm  Warmhandoff:   Warm Hand Off Completed.

## 2016-03-29 NOTE — Patient Instructions (Addendum)
Sara Vasquez was seen today for diabetes and hypertension.  Diagnoses and all orders for this visit:  Uncontrolled diabetes mellitus type 2 without complications, unspecified long term insulin use status (HCC) -     POCT glucose (manual entry) -     POCT glycosylated hemoglobin (Hb A1C)  Essential hypertension, benign  Major depressive disorder with single episode, in partial remission (HCC)  Recurrent major depressive disorder, in partial remission (HCC) -     traZODone (DESYREL) 100 MG tablet; Take 1 tablet (100 mg total) by mouth at bedtime as needed for sleep.  Uncontrolled type 2 diabetes mellitus without complication, without long-term current use of insulin (HCC) -     insulin aspart protamine- aspart (NOVOLOG MIX 70/30) (70-30) 100 UNIT/ML injection; Inject 0.4 mLs (40 Units total) into the skin 2 (two) times daily with a meal.  Left wrist tendinitis   Do not increase amaryl as we discussed as I looked back and you had some low sugars when amaryl was 8 mg Increase novolog 70/30 to 40 U twice daily with food   F/u in 6 weeks for diabetes   Dr. Armen PickupFunches

## 2016-03-30 NOTE — Assessment & Plan Note (Signed)
Declined with rise in A1c Compliant with regimen Increase novolog 70/30 to 40 U BID  Continue amaryl 4 mg daily

## 2016-03-30 NOTE — Assessment & Plan Note (Signed)
Controlled. Continue current regimen. 

## 2016-03-30 NOTE — Assessment & Plan Note (Signed)
Homeless Referred to clinical social worker

## 2016-05-04 ENCOUNTER — Other Ambulatory Visit: Payer: Self-pay | Admitting: Family Medicine

## 2016-05-04 DIAGNOSIS — K219 Gastro-esophageal reflux disease without esophagitis: Secondary | ICD-10-CM

## 2016-05-04 DIAGNOSIS — E1165 Type 2 diabetes mellitus with hyperglycemia: Secondary | ICD-10-CM

## 2016-05-04 DIAGNOSIS — IMO0001 Reserved for inherently not codable concepts without codable children: Secondary | ICD-10-CM

## 2016-05-04 MED FILL — **JANUVIA 100 MG TABLET: 100 | 30 days supply | Qty: 30 | Fill #0

## 2016-05-04 MED FILL — raNITIdine HCL 150 MG TABS: 150 | 30 days supply | Qty: 60 | Fill #0

## 2016-05-04 MED FILL — $NOVOLOG MIX 70/30 VIAL: (70-30) 100 | 28 days supply | Qty: 20 | Fill #3

## 2016-05-04 MED FILL — ?GLIMEPIRIDE 4 MG TABLET: 4 | 30 days supply | Qty: 30 | Fill #4

## 2016-05-04 MED FILL — traZODone HCL 50 MG TABS: 50 | 30 days supply | Qty: 30 | Fill #3

## 2016-05-04 MED FILL — DULoxetine HCL 60 MG CPEP: 60 | 30 days supply | Qty: 30 | Fill #5

## 2016-05-05 ENCOUNTER — Ambulatory Visit: Payer: Self-pay | Attending: Family Medicine | Admitting: Family Medicine

## 2016-05-05 ENCOUNTER — Encounter: Payer: Self-pay | Admitting: Family Medicine

## 2016-05-05 VITALS — BP 116/73 | HR 71 | Temp 98.3°F | Ht 62.5 in | Wt 187.8 lb

## 2016-05-05 DIAGNOSIS — E1165 Type 2 diabetes mellitus with hyperglycemia: Secondary | ICD-10-CM | POA: Insufficient documentation

## 2016-05-05 DIAGNOSIS — Z0001 Encounter for general adult medical examination with abnormal findings: Secondary | ICD-10-CM | POA: Insufficient documentation

## 2016-05-05 DIAGNOSIS — Z1231 Encounter for screening mammogram for malignant neoplasm of breast: Secondary | ICD-10-CM

## 2016-05-05 DIAGNOSIS — K0889 Other specified disorders of teeth and supporting structures: Secondary | ICD-10-CM | POA: Insufficient documentation

## 2016-05-05 DIAGNOSIS — N951 Menopausal and female climacteric states: Secondary | ICD-10-CM | POA: Insufficient documentation

## 2016-05-05 DIAGNOSIS — F17210 Nicotine dependence, cigarettes, uncomplicated: Secondary | ICD-10-CM | POA: Insufficient documentation

## 2016-05-05 DIAGNOSIS — F17200 Nicotine dependence, unspecified, uncomplicated: Secondary | ICD-10-CM

## 2016-05-05 DIAGNOSIS — Z79899 Other long term (current) drug therapy: Secondary | ICD-10-CM | POA: Insufficient documentation

## 2016-05-05 DIAGNOSIS — IMO0001 Reserved for inherently not codable concepts without codable children: Secondary | ICD-10-CM

## 2016-05-05 DIAGNOSIS — Z794 Long term (current) use of insulin: Secondary | ICD-10-CM | POA: Insufficient documentation

## 2016-05-05 LAB — GLUCOSE, POCT (MANUAL RESULT ENTRY): POC Glucose: 193 mg/dl — AB (ref 70–99)

## 2016-05-05 MED ORDER — VENLAFAXINE HCL ER 75 MG PO CP24: 75.0000 mg | ORAL_CAPSULE | Freq: Every day | ORAL | 5 refills | Status: DC

## 2016-05-05 MED ORDER — BENZONATATE 100 MG PO CAPS: 100.0000 mg | ORAL_CAPSULE | Freq: Two times a day (BID) | ORAL | 2 refills | Status: DC | PRN

## 2016-05-05 MED ORDER — AMOXICILLIN 500 MG PO CAPS: 500.0000 mg | ORAL_CAPSULE | Freq: Three times a day (TID) | ORAL | 0 refills | Status: DC

## 2016-05-05 MED FILL — VENLAFAXINE HCL ER 75 MG CA: 75 | 30 days supply | Qty: 30 | Fill #0

## 2016-05-05 MED FILL — ?AMOXICILLIN 500 MG CAPSULE: 500 | 10 days supply | Qty: 30 | Fill #0

## 2016-05-05 MED FILL — BENZONATATE 100 MG CAPSULE: 100 | 15 days supply | Qty: 30 | Fill #0

## 2016-05-05 NOTE — Assessment & Plan Note (Signed)
Hot flashes, depression  Smoking cessation Transition from cymbalta to effexor

## 2016-05-05 NOTE — Assessment & Plan Note (Signed)
Pain with poor dentition Course of amoxicillin

## 2016-05-05 NOTE — Assessment & Plan Note (Signed)
Improved with increased novolog 70/30 dose Continue continue regimen Reduce sugar intake

## 2016-05-05 NOTE — Patient Instructions (Addendum)
Toniann FailWendy was seen today for diabetes.  Diagnoses and all orders for this visit:  Uncontrolled diabetes mellitus type 2 without complications, unspecified long term insulin use status (HCC) -     POCT glucose (manual entry)  Smoking -     benzonatate (TESSALON) 100 MG capsule; Take 1 capsule (100 mg total) by mouth 2 (two) times daily as needed for cough.  Pain, dental -     amoxicillin (AMOXIL) 500 MG capsule; Take 1 capsule (500 mg total) by mouth 3 (three) times daily.  Hot flashes due to menopause -     venlafaxine XR (EFFEXOR XR) 75 MG 24 hr capsule; Take 1 capsule (75 mg total) by mouth daily with breakfast.  Visit for screening mammogram -     MM DIGITAL SCREENING BILATERAL; Future   Smoking cessation support: smoking cessation hotline: 1-800-QUIT-NOW.  Smoking cessation classes are available through [facility]. Call [phone] or visit our website at [email or web address].   F/u in 2 months for diabetes  Dr. Armen PickupFunches

## 2016-05-05 NOTE — Progress Notes (Signed)
Subjective:  Patient ID: Sara Vasquez, female    DOB: 01-Sep-1965  Age: 51 y.o. MRN: [id number]  CC: Diabetes   HPI Sara Vasquez presents for   1. CHRONIC DIABETES  Disease Monitoring  Blood Sugar Ranges:   Fasting: 92-193   Post prandial:  200s   Polyuria: no   Visual problems: no   Medication Compliance: yes  Medication Side Effects  Hypoglycemia: no   2. Dental pain: she has very poor dentition. She has pain in L upper gums with eating. Some gum swelling. No fever or chills.   3. Hot flashes: she smokes and eat sugar. She has worsening hot flashes. She is compliant with cymbalta. She would like treatment, non-hormonal.   Social History  Substance Use Topics  . Smoking status: Current Every Day Smoker    Packs/day: 1.00    Years: 35.00    Types: Cigarettes    Start date: 03/27/2014  . Smokeless tobacco: Never Used  . Alcohol use No    Outpatient Medications Prior to Visit  Medication Sig Dispense Refill  . acetaminophen-codeine (TYLENOL #3) 300-30 MG tablet Take 1 tablet by mouth every 8 (eight) hours as needed for moderate pain. 60 tablet 2  . atorvastatin (LIPITOR) 40 MG tablet TAKE 1 TABLET (40 MG TOTAL) BY MOUTH DAILY. 30 tablet 2  . Blood Glucose Monitoring Suppl (TRUE METRIX METER) w/Device KIT USE AS DIRECTED 1 kit 0  . cholecalciferol (VITAMIN D) 1000 units tablet Take 1,000 Units by mouth daily.    . DULoxetine (CYMBALTA) 60 MG capsule Take 1 capsule (60 mg total) by mouth daily. 30 capsule 5  . glimepiride (AMARYL) 4 MG tablet Take 1 tablet (4 mg total) by mouth daily before breakfast. 30 tablet 5  . glucose blood (TRUE METRIX BLOOD GLUCOSE TEST) test strip 1 each by Other route 3 (three) times daily. 100 each 11  . hydrOXYzine (ATARAX/VISTARIL) 10 MG tablet Take 1 tablet (10 mg total) by mouth 3 (three) times daily as needed. 30 tablet 5  . insulin aspart protamine- aspart (NOVOLOG MIX 70/30) (70-30) 100 UNIT/ML injection Inject 0.4 mLs (40 Units total)  into the skin 2 (two) times daily with a meal. 50 mL 3  . JANUVIA 100 MG tablet TAKE 1 TABLET BY MOUTH DAILY 30 tablet 3  . Multiple Vitamins-Minerals (MULTIVITAL) tablet Take 1 tablet by mouth daily.    . naproxen (NAPROSYN) 500 MG tablet Take 1 tablet (500 mg total) by mouth 2 (two) times daily with a meal. 60 tablet 3  . omega-3 acid ethyl esters (LOVAZA) 1 g capsule TAKE 1 CAPSULE BY MOUTH 2 TIMES DAILY 60 capsule 2  . ramipril (ALTACE) 2.5 MG capsule TAKE 1 CAPSULE BY MOUTH ONCE DAILY 90 capsule 3  . ranitidine (ZANTAC) 150 MG tablet TAKE 2 TABLET BY MOUTH AT BEDTIME. 60 tablet 2  . traZODone (DESYREL) 100 MG tablet Take 1 tablet (100 mg total) by mouth at bedtime as needed for sleep. 30 tablet 3  . gemfibrozil (LOPID) 600 MG tablet TAKE 1 TABLET BY MOUTH 2 TIMES DAILY BEFORE A MEAL (Patient not taking: Reported on 05/05/2016) 60 tablet 5  . meloxicam (MOBIC) 15 MG tablet Take 1 tablet (15 mg total) by mouth daily. (Patient not taking: Reported on 05/05/2016) 30 tablet 0  . methocarbamol (ROBAXIN-750) 750 MG tablet Take 1 tablet (750 mg total) by mouth every 8 (eight) hours as needed for muscle spasms. (Patient not taking: Reported on 05/05/2016) 30 tablet  0  . sodium chloride (OCEAN) 0.65 % SOLN nasal spray Place 1 spray into both nostrils as needed for congestion. (Patient not taking: Reported on 05/05/2016) 15 mL 3   No facility-administered medications prior to visit.     ROS Review of Systems  Constitutional: Positive for fatigue. Negative for chills and fever.  HENT: Positive for dental problem.   Eyes: Negative for visual disturbance.  Respiratory: Positive for cough. Negative for shortness of breath.   Cardiovascular: Negative for chest pain.  Gastrointestinal: Negative for abdominal pain and blood in stool.  Musculoskeletal: Positive for arthralgias, back pain, gait problem and joint swelling. Negative for myalgias, neck pain and neck stiffness.  Skin: Negative for rash.    Allergic/Immunologic: Negative for immunocompromised state.  Hematological: Negative for adenopathy. Does not bruise/bleed easily.  Psychiatric/Behavioral: Positive for dysphoric mood and sleep disturbance. Negative for suicidal ideas. The patient is nervous/anxious.     Objective:  BP 116/73 (BP Location: Left Arm, Patient Position: Sitting, Cuff Size: Small)   Pulse 71   Temp 98.3 F (36.8 C) (Oral)   Ht 5' 2.5" (1.588 m)   Wt 187 lb 12.8 oz (85.2 kg)   SpO2 99%   BMI 33.80 kg/m   BP/Weight 05/05/2016 03/29/2016 53/74/8270  Systolic BP 786 754 492  Diastolic BP 73 86 77  Wt. (Lbs) 187.8 191 190  BMI 33.8 34.38 34.75    Physical Exam  Constitutional: She is oriented to person, place, and time. She appears well-developed and well-nourished. No distress.  HENT:  Head: Normocephalic and atraumatic.  Mouth/Throat: Abnormal dentition. Dental caries present.  Cardiovascular: Normal rate, regular rhythm, normal heart sounds and intact distal pulses.   Pulmonary/Chest: Effort normal and breath sounds normal.  Musculoskeletal: She exhibits no edema.       Right hand: She exhibits decreased range of motion and swelling (mild ). She exhibits no tenderness, normal two-point discrimination, normal capillary refill, no deformity and no laceration. Normal sensation noted. Normal strength noted.       Left hand: She exhibits decreased range of motion and swelling. She exhibits no tenderness, no bony tenderness, normal capillary refill, no deformity and no laceration. Normal sensation noted. Normal strength noted.     Neurological: She is alert and oriented to person, place, and time.  Skin: Skin is warm and dry. No rash noted.  Psychiatric: She has a normal mood and affect.   Lab Results  Component Value Date   HGBA1C 8.9 03/29/2016   CBG 193  Depression screen [facility] 2/9 05/05/2016 03/29/2016 11/23/2015 10/01/2015 08/18/2015  Decreased Interest '1 1 2 2 3  '$ Down, Depressed, Hopeless '1 2 2 3 3   '$ PHQ - 2 Score '2 3 4 5 6  '$ Altered sleeping '1 2 3 3 2  '$ Tired, decreased energy '1 1 3 2 3  '$ Change in appetite '1 1 2 2 3  '$ Feeling bad or failure about yourself  0 0 0 1 1  Trouble concentrating 1 0 '1 3 1  '$ Moving slowly or fidgety/restless 0 '1 1 1 2  '$ Suicidal thoughts 0 0 0 0 0  PHQ-9 Score '6 8 14 17 18  '$ Some recent data might be hidden   GAD 7 : Generalized Anxiety Score 05/05/2016 03/29/2016 11/23/2015 10/01/2015  Nervous, Anxious, on Edge '2 1 1 3  '$ Control/stop worrying '2 1 2 3  '$ Worry too much - different things '2 1 2 3  '$ Trouble relaxing '1 1 2 3  '$ Restless '1 1 1 '$ 2  Easily annoyed or irritable '1 1 1 3  '$ Afraid - awful might happen '2 1 2 2  '$ Total GAD 7 Score '11 7 11 19      '$ Assessment & Plan:  Apryll was seen today for diabetes.  Diagnoses and all orders for this visit:  Uncontrolled diabetes mellitus type 2 without complications, unspecified long term insulin use status (HCC) -     POCT glucose (manual entry)  Smoking -     benzonatate (TESSALON) 100 MG capsule; Take 1 capsule (100 mg total) by mouth 2 (two) times daily as needed for cough.  Pain, dental -     amoxicillin (AMOXIL) 500 MG capsule; Take 1 capsule (500 mg total) by mouth 3 (three) times daily.  Hot flashes due to menopause -     venlafaxine XR (EFFEXOR XR) 75 MG 24 hr capsule; Take 1 capsule (75 mg total) by mouth daily with breakfast.  Visit for screening mammogram -     MM DIGITAL SCREENING BILATERAL; Future   There are no diagnoses linked to this encounter.  No orders of the defined types were placed in this encounter.   Follow-up: Return in about 2 months (around 07/03/2016) for diabetes .   Boykin Nearing MD

## 2016-05-19 ENCOUNTER — Encounter: Payer: Self-pay | Admitting: Physician Assistant

## 2016-05-19 ENCOUNTER — Ambulatory Visit: Payer: Self-pay | Attending: Family Medicine | Admitting: Physician Assistant

## 2016-05-19 ENCOUNTER — Ambulatory Visit (HOSPITAL_COMMUNITY)
Admission: RE | Admit: 2016-05-19 | Discharge: 2016-05-19 | Disposition: A | Discharge status: Home / Self Care | Payer: Self-pay | Source: Ambulatory Visit | Attending: Physician Assistant | Admitting: Physician Assistant

## 2016-05-19 VITALS — BP 169/89 | HR 65 | Temp 97.9°F | Resp 16 | Wt 189.4 lb

## 2016-05-19 DIAGNOSIS — B373 Candidiasis of vulva and vagina: Secondary | ICD-10-CM | POA: Insufficient documentation

## 2016-05-19 DIAGNOSIS — B3731 Acute candidiasis of vulva and vagina: Secondary | ICD-10-CM

## 2016-05-19 DIAGNOSIS — M79644 Pain in right finger(s): Secondary | ICD-10-CM

## 2016-05-19 DIAGNOSIS — Z0001 Encounter for general adult medical examination with abnormal findings: Secondary | ICD-10-CM | POA: Insufficient documentation

## 2016-05-19 DIAGNOSIS — E118 Type 2 diabetes mellitus with unspecified complications: Secondary | ICD-10-CM

## 2016-05-19 DIAGNOSIS — Z794 Long term (current) use of insulin: Secondary | ICD-10-CM | POA: Insufficient documentation

## 2016-05-19 DIAGNOSIS — E1165 Type 2 diabetes mellitus with hyperglycemia: Secondary | ICD-10-CM | POA: Insufficient documentation

## 2016-05-19 DIAGNOSIS — E785 Hyperlipidemia, unspecified: Secondary | ICD-10-CM

## 2016-05-19 DIAGNOSIS — I10 Essential (primary) hypertension: Secondary | ICD-10-CM | POA: Insufficient documentation

## 2016-05-19 DIAGNOSIS — Z79899 Other long term (current) drug therapy: Secondary | ICD-10-CM | POA: Insufficient documentation

## 2016-05-19 DIAGNOSIS — R062 Wheezing: Secondary | ICD-10-CM | POA: Insufficient documentation

## 2016-05-19 DIAGNOSIS — M1811 Unilateral primary osteoarthritis of first carpometacarpal joint, right hand: Secondary | ICD-10-CM | POA: Insufficient documentation

## 2016-05-19 DIAGNOSIS — E1169 Type 2 diabetes mellitus with other specified complication: Secondary | ICD-10-CM

## 2016-05-19 LAB — GLUCOSE, POCT (MANUAL RESULT ENTRY): POC GLUCOSE: 167 mg/dL — AB (ref 70–99)

## 2016-05-19 MED ORDER — ALBUTEROL SULFATE HFA 108 (90 BASE) MCG/ACT IN AERS: 2.0000 | INHALATION_SPRAY | Freq: Four times a day (QID) | RESPIRATORY_TRACT | 2 refills | Status: DC | PRN

## 2016-05-19 MED ORDER — FLUCONAZOLE 150 MG PO TABS: ORAL_TABLET | ORAL | 0 refills | Status: DC

## 2016-05-19 MED FILL — VENTOLIN HFA 90 MCG INHALER: 108 (90 BAS | 30 days supply | Qty: 18 | Fill #0

## 2016-05-19 MED FILL — FLUCONAZOLE 150 MG TABLET: 150 | 1 days supply | Qty: 1 | Fill #0

## 2016-05-19 NOTE — Progress Notes (Signed)
Sara Vasquez, is a 51 y.o. female  OBS:[id number]  UTM:[id number]  DOB - [birth year]  Subjective:  Chief Complaint and HPI: Sara Vasquez is a 51 y.o. female here today for cough, congestion, and R thumb pain.  She has been having cough for about 1 week.  Non-productive cough. Her husband recently had influenza A and was treated with Tamiflu.  The patient did have a flu shot this season.  She is currently about to finish a course of amoxicillin for an infected tooth.  She denies f/c.  She is requesting an inhaler for wheezing.  + vaginal itching a few days after being on amoxicillin.  C/o R thumb pain for several weeks and feels a little "lump" in the area.  NKI.    ED/Hospital notes reviewed.     ROS:   Constitutional:  No f/c, No night sweats, No unexplained weight loss. EENT:  No vision changes, No blurry vision, No hearing changes. No mouth, throat, or ear problems.  Respiratory: + cough, No SOB Cardiac: No CP, no palpitations GI:  No abd pain, No N/V/D. GU: No Urinary s/sx Musculoskeletal: +R thumb joint pain Neuro: No headache, no dizziness, no motor weakness.  Skin: No rash Endocrine:  No polydipsia. No polyuria.  Psych: Denies SI/HI  No problems updated.  ALLERGIES: Allergies  Allergen Reactions  . Fentanyl   . Metformin And Related     Mood swings  . Phenergan [Promethazine Hcl]     PAST MEDICAL HISTORY: Past Medical History:  Diagnosis Date  . Anxiety   . Arthritis   . Depression   . Diabetes mellitus without complication ([place])   . Hypertension     MEDICATIONS AT HOME: Prior to Admission medications   Medication Sig Start Date End Date Taking? Authorizing Provider  glimepiride (AMARYL) 4 MG tablet Take 1 tablet (4 mg total) by mouth daily before breakfast. 10/01/15  Yes Josalyn Funches, MD  glucose blood (TRUE METRIX BLOOD GLUCOSE TEST) test strip 1 each by Other route 3 (three) times daily. 07/09/15  Yes Josalyn Funches, MD  hydrOXYzine (ATARAX/VISTARIL)  10 MG tablet Take 1 tablet (10 mg total) by mouth 3 (three) times daily as needed. 11/23/15  Yes Josalyn Funches, MD  insulin aspart protamine- aspart (NOVOLOG MIX 70/30) (70-30) 100 UNIT/ML injection Inject 0.4 mLs (40 Units total) into the skin 2 (two) times daily with a meal. 03/29/16  Yes Josalyn Funches, MD  JANUVIA 100 MG tablet TAKE 1 TABLET BY MOUTH DAILY 05/04/16  Yes Josalyn Funches, MD  Multiple Vitamins-Minerals (MULTIVITAL) tablet Take 1 tablet by mouth daily.   Yes Historical Provider, MD  naproxen (NAPROSYN) 500 MG tablet Take 1 tablet (500 mg total) by mouth 2 (two) times daily with a meal. 10/08/15  Yes Josalyn Funches, MD  omega-3 acid ethyl esters (LOVAZA) 1 g capsule TAKE 1 CAPSULE BY MOUTH 2 TIMES DAILY 03/11/16  Yes Josalyn Funches, MD  ramipril (ALTACE) 2.5 MG capsule TAKE 1 CAPSULE BY MOUTH ONCE DAILY 03/12/15  Yes Josalyn Funches, MD  ranitidine (ZANTAC) 150 MG tablet TAKE 2 TABLET BY MOUTH AT BEDTIME. 05/04/16  Yes Josalyn Funches, MD  traZODone (DESYREL) 100 MG tablet Take 1 tablet (100 mg total) by mouth at bedtime as needed for sleep. 03/29/16  Yes Josalyn Funches, MD  venlafaxine XR (EFFEXOR XR) 75 MG 24 hr capsule Take 1 capsule (75 mg total) by mouth daily with breakfast. 05/05/16  Yes Josalyn Funches, MD  acetaminophen-codeine (TYLENOL #3) 300-30 MG tablet Take 1  tablet by mouth every 8 (eight) hours as needed for moderate pain. 08/18/15   Josalyn Funches, MD  albuterol (PROVENTIL HFA;VENTOLIN HFA) 108 (90 Base) MCG/ACT inhaler Inhale 2 puffs into the lungs every 6 (six) hours as needed for wheezing or shortness of breath. 05/19/16   Argentina Donovan, PA-C  amoxicillin (AMOXIL) 500 MG capsule Take 1 capsule (500 mg total) by mouth 3 (three) times daily. Patient not taking: Reported on 05/19/2016 05/05/16   Boykin Nearing, MD  atorvastatin (LIPITOR) 40 MG tablet TAKE 1 TABLET (40 MG TOTAL) BY MOUTH DAILY. 03/11/16   Josalyn Funches, MD  benzonatate (TESSALON) 100 MG capsule Take  1 capsule (100 mg total) by mouth 2 (two) times daily as needed for cough. Patient not taking: Reported on 05/19/2016 05/05/16   Boykin Nearing, MD  Blood Glucose Monitoring Suppl (TRUE METRIX METER) w/Device KIT USE AS DIRECTED 12/02/15   Boykin Nearing, MD  cholecalciferol (VITAMIN D) 1000 units tablet Take 1,000 Units by mouth daily.    Historical Provider, MD  fluconazole (DIFLUCAN) 150 MG tablet Take 1 tablet for yeast infection 05/19/16   Argentina Donovan, PA-C  gemfibrozil (LOPID) 600 MG tablet TAKE 1 TABLET BY MOUTH 2 TIMES DAILY BEFORE A MEAL Patient not taking: Reported on 05/19/2016 06/19/15   Boykin Nearing, MD  meloxicam (MOBIC) 15 MG tablet Take 1 tablet (15 mg total) by mouth daily. Patient not taking: Reported on 05/05/2016 03/17/16   Jami L Hagler, PA-C  methocarbamol (ROBAXIN-750) 750 MG tablet Take 1 tablet (750 mg total) by mouth every 8 (eight) hours as needed for muscle spasms. Patient not taking: Reported on 05/05/2016 04/02/15   Boykin Nearing, MD     Objective:  EXAM:   Vitals:   05/19/16 0911  BP: (!) 169/89  Pulse: 65  Resp: 16  Temp: 97.9 F (36.6 C)  TempSrc: Oral  SpO2: 97%  Weight: 189 lb 6.4 oz (85.9 kg)    General appearance : A&OX3. NAD. Non-toxic-appearing HEENT: Atraumatic and Normocephalic.  PERRLA. EOM intact.  TM clear B. Mouth-MMM, post pharynx WNL w/o erythema, No PND. Neck: supple, no JVD. No cervical lymphadenopathy. No thyromegaly Chest/Lungs:  Breathing-non-labored, Good air entry bilaterally, breath sounds normal without rales or rhonchi.  There is minimal wheezing throughout CVS: S1 S2 regular, no murmurs, gallops, rubs  Extremities: Bilateral Lower Ext shows no edema, both legs are warm to touch with = pulse throughout.  R thumb-clubbing of nail/finger.  There is a mobile and discreet 3 mm nodule at the base of the thumb in the web space.  Grossly the thumb appears normal.  N-V intact.  FullS&ROM.  Neg Finkelstein's Neurology:  CN II-XII  grossly intact, Non focal.   Psych:  TP linear. J/I WNL. Normal speech. Appropriate eye contact and affect.  Skin:  No Rash  Data Review Lab Results  Component Value Date   HGBA1C 8.9 03/29/2016   HGBA1C 8.3 10/01/2015   HGBA1C 9.3 07/10/2015     Assessment & Plan   1. Pain of right thumb With small nodule/?ganglion cyst - DG Finger Thumb Right; Future-done today- Diffuse degenerative change. No acute bony abnormality. Tiny punctate radiopaque foreign body noted over the volar aspect of distal left thumb cannot be excluded(distal thumb is not the area where she is having pain) I recommended she get a thumb spica splint to wear day and night X 2 weeks then daytime only.  May refer pending xray.  2. Yeast vaginitis Likely due to current antibiotics  and diabetes - fluconazole (DIFLUCAN) 150 MG tablet; Take 1 tablet for yeast infection  Dispense: 1 tablet; Refill: 0  3. Wheezing Likely very mild flu type illness since husband recently had flu and the patient has been immunized.  Treat symptomatically - albuterol (PROVENTIL HFA;VENTOLIN HFA) 108 (90 Base) MCG/ACT inhaler; Inhale 2 puffs into the lungs every 6 (six) hours as needed for wheezing or shortness of breath.  Dispense: 1 Inhaler; Refill: 2  4. Essential hypertension, benign Uncontrolled.  Stop taking benadryl daily.  Adhere to medication regimen.  DASH diet.  Smoking cessation  5. Uncontrolled type 2 diabetes mellitus with complication, unspecified long term insulin use status ([place]) Uncontrolled; recent OV to address.  Continue that regimen   Patient have been counseled extensively about nutrition and exercise  F/up 3 months for DM;htn  The patient was given clear instructions to go to ER or return to medical center if symptoms don't improve, worsen or new problems develop. The patient verbalized understanding. The patient was told to call to get lab results if they haven't heard anything in the next week.     Freeman Caldron, PA-C [facility] [place], [place]   05/19/2016, 9:28 AMPatient ID: SAMHITA KRETSCH, female   DOB: [birth year], 51 y.o.   MRN: [id number]

## 2016-05-20 ENCOUNTER — Telehealth: Payer: Self-pay

## 2016-05-20 NOTE — Telephone Encounter (Signed)
Contacted pt to go over xray results pt is aware of results and doesn't have any questions or concerns 

## 2016-05-24 ENCOUNTER — Telehealth: Payer: Self-pay | Admitting: Family Medicine

## 2016-05-24 NOTE — Telephone Encounter (Signed)
Patient was seen by NP and was diagnosed with arthritis and prescribed medication to help with pain, patient is still experiencing discomfort and pain....  Please follow up.

## 2016-05-25 NOTE — Telephone Encounter (Signed)
Pt was called and pt states that she needs pain medication for her thumb. Pt states that her entire hand is swollen. Pt also want to know if it is ok to take Humulin 70/30 insulin that she received from a friend.

## 2016-05-31 MED ORDER — MELOXICAM 15 MG PO TABS: 15.0000 mg | ORAL_TABLET | Freq: Every day | ORAL | 0 refills | Status: DC

## 2016-05-31 NOTE — Telephone Encounter (Signed)
mobic ordered for thumb pain and swelling. This is an antiinflammatory  No, I recommend that she stick with her novolog 70/30

## 2016-06-01 NOTE — Telephone Encounter (Signed)
Pt was called and informed of medication being sent to onsite pharmacy. 

## 2016-06-02 ENCOUNTER — Other Ambulatory Visit: Payer: Self-pay | Admitting: Family Medicine

## 2016-06-02 MED ORDER — MELOXICAM 15 MG PO TABS: 15.0000 mg | ORAL_TABLET | Freq: Every day | ORAL | 0 refills | Status: DC

## 2016-06-02 MED FILL — ?MELOXICAM 15 MG TABLET: 15 MG | 30 days supply | Qty: 30 | Fill #0

## 2016-06-12 ENCOUNTER — Emergency Department
Admission: EM | Admit: 2016-06-12 | Discharge: 2016-06-12 | Disposition: A | Discharge status: Home / Self Care | Payer: Self-pay | Attending: Emergency Medicine | Admitting: Emergency Medicine

## 2016-06-12 ENCOUNTER — Encounter: Payer: Self-pay | Admitting: Medical Oncology

## 2016-06-12 ENCOUNTER — Emergency Department: Payer: Self-pay

## 2016-06-12 DIAGNOSIS — F17210 Nicotine dependence, cigarettes, uncomplicated: Secondary | ICD-10-CM | POA: Insufficient documentation

## 2016-06-12 DIAGNOSIS — Z79899 Other long term (current) drug therapy: Secondary | ICD-10-CM | POA: Insufficient documentation

## 2016-06-12 DIAGNOSIS — E119 Type 2 diabetes mellitus without complications: Secondary | ICD-10-CM | POA: Insufficient documentation

## 2016-06-12 DIAGNOSIS — M65311 Trigger thumb, right thumb: Secondary | ICD-10-CM | POA: Insufficient documentation

## 2016-06-12 DIAGNOSIS — Z7984 Long term (current) use of oral hypoglycemic drugs: Secondary | ICD-10-CM | POA: Insufficient documentation

## 2016-06-12 DIAGNOSIS — I10 Essential (primary) hypertension: Secondary | ICD-10-CM | POA: Insufficient documentation

## 2016-06-12 MED ORDER — DICLOFENAC SODIUM 75 MG PO TBEC: 75.0000 mg | DELAYED_RELEASE_TABLET | Freq: Two times a day (BID) | ORAL | 0 refills | Status: DC

## 2016-06-12 NOTE — ED Provider Notes (Signed)
[facility] Emergency Department Provider Note   ____________________________________________   First MD Initiated Contact with Patient 06/12/16 [phone]     (approximate)  I have reviewed the triage vital signs and the nursing notes.   HISTORY  Chief Complaint Hand Pain    HPI ALEYZA SALMI is a 51 y.o. female is here complaining of right thumb pain that began for over a month ago. Patient is been taken there is a prescription anti-inflammatories as well as over-the-counter without any relief. Patient denies any history of injury. Patient states that she is right-hand dominant. Occasionally when she bends her thumb it feels as if it is getting stuck. Patient experiences a pop when it straightens out. She has not seen her PCP for this problem. Patient rates her pain as an 8 out of 10.   Past Medical History:  Diagnosis Date  . Anxiety   . Arthritis   . Depression   . Diabetes mellitus without complication ([place])   . Hypertension     Patient Active Problem List   Diagnosis Date Noted  . Hot flashes due to menopause 05/05/2016  . Pain, dental 05/05/2016  . Left wrist tendinitis 03/29/2016  . Homelessness 11/23/2015  . Toe injury 11/23/2015  . Other fatigue 11/23/2015  . Esophageal reflux 10/01/2015  . Bilateral hand swelling 10/01/2015  . Chronic low back pain 07/09/2015  . Elevated triglycerides with high cholesterol 06/19/2015  . Traumatic myalgia 04/02/2015  . Acute posttraumatic stress disorder 04/02/2015  . Diabetes type 2, uncontrolled ([place]) 02/04/2015  . Tendinopathy of right rotator cuff 02/04/2015  . Vitamin D deficiency 03/19/2014  . Abnormal LFTs 03/19/2014  . Hyperlipidemia associated with type 2 diabetes mellitus ([place]) 03/19/2014  . Essential hypertension, benign 12/19/2013  . Smoking 12/19/2013  . Depression 12/19/2013    Past Surgical History:  Procedure Laterality Date  . ABDOMINAL HYSTERECTOMY    . BACK SURGERY    .  CHOLECYSTECTOMY      Prior to Admission medications   Medication Sig Start Date End Date Taking? Authorizing Provider  acetaminophen-codeine (TYLENOL #3) 300-30 MG tablet Take 1 tablet by mouth every 8 (eight) hours as needed for moderate pain. 08/18/15   Josalyn Funches, MD  albuterol (PROVENTIL HFA;VENTOLIN HFA) 108 (90 Base) MCG/ACT inhaler Inhale 2 puffs into the lungs every 6 (six) hours as needed for wheezing or shortness of breath. 05/19/16   Argentina Donovan, PA-C  amoxicillin (AMOXIL) 500 MG capsule Take 1 capsule (500 mg total) by mouth 3 (three) times daily. Patient not taking: Reported on 05/19/2016 05/05/16   Boykin Nearing, MD  atorvastatin (LIPITOR) 40 MG tablet TAKE 1 TABLET (40 MG TOTAL) BY MOUTH DAILY. 03/11/16   Josalyn Funches, MD  benzonatate (TESSALON) 100 MG capsule Take 1 capsule (100 mg total) by mouth 2 (two) times daily as needed for cough. Patient not taking: Reported on 05/19/2016 05/05/16   Boykin Nearing, MD  Blood Glucose Monitoring Suppl (TRUE METRIX METER) w/Device KIT USE AS DIRECTED 12/02/15   Boykin Nearing, MD  cholecalciferol (VITAMIN D) 1000 units tablet Take 1,000 Units by mouth daily.    Historical Provider, MD  fluconazole (DIFLUCAN) 150 MG tablet Take 1 tablet for yeast infection 05/19/16   Argentina Donovan, PA-C  gemfibrozil (LOPID) 600 MG tablet TAKE 1 TABLET BY MOUTH 2 TIMES DAILY BEFORE A MEAL Patient not taking: Reported on 05/19/2016 06/19/15   Boykin Nearing, MD  glimepiride (AMARYL) 4 MG tablet Take 1 tablet (4 mg  total) by mouth daily before breakfast. 10/01/15   Dessa Phi, MD  glucose blood (TRUE METRIX BLOOD GLUCOSE TEST) test strip 1 each by Other route 3 (three) times daily. 07/09/15   Josalyn Funches, MD  hydrOXYzine (ATARAX/VISTARIL) 10 MG tablet Take 1 tablet (10 mg total) by mouth 3 (three) times daily as needed. 11/23/15   Josalyn Funches, MD  insulin aspart protamine- aspart (NOVOLOG MIX 70/30) (70-30) 100 UNIT/ML injection Inject 0.4 mLs  (40 Units total) into the skin 2 (two) times daily with a meal. 03/29/16   Josalyn Funches, MD  JANUVIA 100 MG tablet TAKE 1 TABLET BY MOUTH DAILY 05/04/16   Dessa Phi, MD  meloxicam (MOBIC) 15 MG tablet Take 1 tablet (15 mg total) by mouth daily. 06/02/16   Dessa Phi, MD  methocarbamol (ROBAXIN-750) 750 MG tablet Take 1 tablet (750 mg total) by mouth every 8 (eight) hours as needed for muscle spasms. Patient not taking: Reported on 05/05/2016 04/02/15   Dessa Phi, MD  Multiple Vitamins-Minerals (MULTIVITAL) tablet Take 1 tablet by mouth daily.    Historical Provider, MD  omega-3 acid ethyl esters (LOVAZA) 1 g capsule TAKE 1 CAPSULE BY MOUTH 2 TIMES DAILY 03/11/16   Josalyn Funches, MD  ramipril (ALTACE) 2.5 MG capsule TAKE 1 CAPSULE BY MOUTH ONCE DAILY 03/12/15   Josalyn Funches, MD  ranitidine (ZANTAC) 150 MG tablet TAKE 2 TABLET BY MOUTH AT BEDTIME. 05/04/16   Josalyn Funches, MD  traZODone (DESYREL) 100 MG tablet Take 1 tablet (100 mg total) by mouth at bedtime as needed for sleep. 03/29/16   Dessa Phi, MD  venlafaxine XR (EFFEXOR XR) 75 MG 24 hr capsule Take 1 capsule (75 mg total) by mouth daily with breakfast. 05/05/16   Dessa Phi, MD    Allergies Fentanyl; Metformin and related; and Phenergan [promethazine hcl]  Family History  Problem Relation Age of Onset  . Diabetes Mother   . Heart disease Mother   . Hypertension Father   . Heart disease Father   . Cancer Paternal Aunt     lung cancer  . Cancer Maternal Grandmother     breast cancer  . Diabetes Maternal Grandfather     Social History Social History  Substance Use Topics  . Smoking status: Current Every Day Smoker    Packs/day: 1.00    Years: 35.00    Types: Cigarettes    Start date: 03/27/2014  . Smokeless tobacco: Never Used  . Alcohol use No    Review of Systems Constitutional: No fever/chills Cardiovascular: Denies chest pain. Respiratory: Denies shortness of breath. Gastrointestinal: No  abdominal pain.  No nausea, no vomiting.  Musculoskeletal: Positive for right hand pain. Positive for right thumb pain. Skin: Negative for rash. Neurological: Negative for headaches, focal weakness or numbness.  10-point ROS otherwise negative.  ____________________________________________   PHYSICAL EXAM:  VITAL SIGNS: ED Triage Vitals  Enc Vitals Group     BP 06/12/16 0929 (!) 143/65     Pulse Rate 06/12/16 0929 68     Resp 06/12/16 0929 18     Temp 06/12/16 0929 97.9 F (36.6 C)     Temp Source 06/12/16 0929 Oral     SpO2 06/12/16 0929 97 %     Weight 06/12/16 0924 189 lb (85.7 kg)     Height --      Head Circumference --      Peak Flow --      Pain Score 06/12/16 0924 8     Pain  Loc --      Pain Edu? --      Excl. in [place]? --     Constitutional: Alert and oriented. Well appearing and in no acute distress. Eyes: Conjunctivae are normal. PERRL. EOMI. Head: Atraumatic. Nose: No congestion/rhinnorhea. Neck: No stridor.   Cardiovascular: Normal rate, regular rhythm. Grossly normal heart sounds.  Good peripheral circulation. Respiratory: Normal respiratory effort.  No retractions. Lungs CTAB. Musculoskeletal: On examination the right hand there is no gross deformity noted. There is slow and guarded movement of the right thumb. There is no locking noted at this time. Patient has minimal tenderness on palpation. Remainder of her digits and flex and extend normally. Patient is tender at the base of the right thumb. No soft tissue swelling was noted. Neurologic:  Normal speech and language. No gross focal neurologic deficits are appreciated. No gait instability. Skin:  Skin is warm, dry and intact. No erythema, ecchymosis or abrasions are seen. Psychiatric: Mood and affect are normal. Speech and behavior are normal.  ____________________________________________   LABS (all labs ordered are listed, but only abnormal results are displayed)  Labs Reviewed - No data to  display  RADIOLOGY X-ray right hand shows no evidence of fracture dislocation per radiologist.   Leana Gamer, personally viewed and evaluated these images (plain radiographs) as part of my medical decision making, as well as reviewing the written report by the radiologist.  ____________________________________________   PROCEDURES  Procedure(s) performed: None  Procedures  Critical Care performed: No  ____________________________________________   INITIAL IMPRESSION / ASSESSMENT AND PLAN / ED COURSE  Pertinent labs & imaging results that were available during my care of the patient were reviewed by me and considered in my medical decision making (see chart for details).  Discussed frequent switches of anti-inflammatories with patient. She has been taking over-the-counter medication for several days and then switching to another one. She also had left overs of meloxicam and naproxen at home which she also took during this time period. Patient states that she has not had any relief with any these medications. Patient was made aware of her x-ray findings. She is given information about her fingers. She is also given a prescription for Voltaren 75 mg twice a day for inflammation. She is also to call and make an appointment with Dr. Mack Guise to follow-up for her traces of her trigger thumb.      ____________________________________________   FINAL CLINICAL IMPRESSION(S) / ED DIAGNOSES  Final diagnoses:  None      NEW MEDICATIONS STARTED DURING THIS VISIT:  New Prescriptions   No medications on file     Note:  This document was prepared using Dragon voice recognition software and may include unintentional dictation errors.    Johnn Hai, PA-C 06/12/16 [place], MD 06/12/16 [phone]

## 2016-06-12 NOTE — Discharge Instructions (Signed)
Call and make an appointment with Dr. Martha ClanKrasinski for problems with your thumb. Begin taking Voltaren 75 mg twice a day with food. Do not take over-the-counter medication with this.

## 2016-06-12 NOTE — ED Notes (Signed)
See triage note  States she noticed pain to right thumb about 2 weeks ago   States having some swelling  Pain radiates from base of thumb into wrist  Denies any injury

## 2016-06-12 NOTE — ED Triage Notes (Signed)
Pt reports for over a couple of weeks her rt hand thumb has "been locking up". Denies injury.

## 2016-06-14 MED FILL — ?DICLOFENAC SOD DR 75 MG TA: 75 | 10 days supply | Qty: 20 | Fill #0

## 2016-06-24 ENCOUNTER — Other Ambulatory Visit: Payer: Self-pay | Admitting: Family Medicine

## 2016-06-24 DIAGNOSIS — F32A Depression, unspecified: Secondary | ICD-10-CM

## 2016-06-24 DIAGNOSIS — F329 Major depressive disorder, single episode, unspecified: Secondary | ICD-10-CM

## 2016-06-24 MED FILL — raNITIdine HCL 150 MG TABS: 150 | 30 days supply | Qty: 60 | Fill #1

## 2016-06-24 MED FILL — ATORVASTATIN 40 MG TABLET: 40 | 30 days supply | Qty: 30 | Fill #1

## 2016-06-24 MED FILL — NOVOLOG MIX 70/30 VIAL: (70-30) 100 | 28 days supply | Qty: 20 | Fill #4

## 2016-06-24 MED FILL — traZODone HCL 100 MG TABS: 100 | 30 days supply | Qty: 30 | Fill #0

## 2016-06-24 MED FILL — VENLAFAXINE HCL ER 75 MG CA: 75 | 30 days supply | Qty: 30 | Fill #1

## 2016-06-27 ENCOUNTER — Ambulatory Visit: Payer: Self-pay | Attending: Family Medicine

## 2016-06-27 MED FILL — RAMIPRIL 2.5 MG CAPSULE: 2.5 | 30 days supply | Qty: 30 | Fill #0

## 2016-07-12 ENCOUNTER — Ambulatory Visit: Payer: Self-pay | Attending: Family Medicine | Admitting: Family Medicine

## 2016-07-12 ENCOUNTER — Encounter: Payer: Self-pay | Admitting: Family Medicine

## 2016-07-12 VITALS — BP 138/78 | HR 68 | Temp 98.0°F | Ht 62.5 in | Wt 190.4 lb

## 2016-07-12 DIAGNOSIS — M7989 Other specified soft tissue disorders: Secondary | ICD-10-CM | POA: Insufficient documentation

## 2016-07-12 DIAGNOSIS — Z79899 Other long term (current) drug therapy: Secondary | ICD-10-CM | POA: Insufficient documentation

## 2016-07-12 DIAGNOSIS — E1165 Type 2 diabetes mellitus with hyperglycemia: Secondary | ICD-10-CM | POA: Insufficient documentation

## 2016-07-12 DIAGNOSIS — M79644 Pain in right finger(s): Secondary | ICD-10-CM | POA: Insufficient documentation

## 2016-07-12 DIAGNOSIS — Z794 Long term (current) use of insulin: Secondary | ICD-10-CM | POA: Insufficient documentation

## 2016-07-12 DIAGNOSIS — Z5189 Encounter for other specified aftercare: Secondary | ICD-10-CM | POA: Insufficient documentation

## 2016-07-12 DIAGNOSIS — F17210 Nicotine dependence, cigarettes, uncomplicated: Secondary | ICD-10-CM | POA: Insufficient documentation

## 2016-07-12 DIAGNOSIS — E118 Type 2 diabetes mellitus with unspecified complications: Secondary | ICD-10-CM

## 2016-07-12 DIAGNOSIS — I10 Essential (primary) hypertension: Secondary | ICD-10-CM | POA: Insufficient documentation

## 2016-07-12 LAB — GLUCOSE, POCT (MANUAL RESULT ENTRY): POC Glucose: 244 mg/dl — AB (ref 70–99)

## 2016-07-12 LAB — POCT GLYCOSYLATED HEMOGLOBIN (HGB A1C): Hemoglobin A1C: 8.3

## 2016-07-12 MED ORDER — INSULIN ASPART PROT & ASPART (70-30 MIX) 100 UNIT/ML ~~LOC~~ SUSP: 50.0000 [IU] | Freq: Two times a day (BID) | SUBCUTANEOUS | 3 refills | Status: DC

## 2016-07-12 MED ORDER — ASPIRIN EC 81 MG PO TBEC: 81.0000 mg | DELAYED_RELEASE_TABLET | Freq: Every day | ORAL | 11 refills | Status: AC

## 2016-07-12 MED ORDER — PREDNISONE 20 MG PO TABS: ORAL_TABLET | ORAL | 0 refills | Status: DC

## 2016-07-12 MED FILL — $NOVOLOG MIX 70/30 VIAL: (70-30) 100 | 30 days supply | Qty: 30 | Fill #0

## 2016-07-12 MED FILL — ?PREDNISONE 20 MG TABLET: 20 | 12 days supply | Qty: 24 | Fill #0

## 2016-07-12 NOTE — Assessment & Plan Note (Signed)
A: R thumb pain x 2 months with arthritic changes on x-ray. Intolerant of brace. No improvement with NSAID P: Steroid course

## 2016-07-12 NOTE — Progress Notes (Signed)
Subjective:  Patient ID: Sara Vasquez, female    DOB: 1966-03-18  Age: 51 y.o. MRN: [id number]  CC: Hand Pain   HPI Sara Vasquez is a smoker with hypertension and diabetes she presents for    1. R thumb pain/ED f/u: she was seen in on ED on 06/12/2016 for R thumb pain for greater than 1 month. Pain associated with locking and popping of the MCP joint. Over the counter and prescription anti-inflammatories have not helped. She is right-hand dominant. Current level of pain is 8/10. She continues to have swelling and tenderness at her medial thumb. She is currently uninsured. She has been intolerant of bracing.   DG thumb R 05/19/2016: IMPRESSION: Diffuse degenerative change. No acute bony abnormality. Tiny punctate radiopaque foreign body noted over the volar aspect of distal left thumb cannot be excluded    DG R hand 06/12/2016: negative  2. Diabetes: compliant with regimen. Eating high carb diet. No low sugars.   Social History  Substance Use Topics  . Smoking status: Current Every Day Smoker    Packs/day: 1.00    Years: 35.00    Types: Cigarettes    Start date: 03/27/2014  . Smokeless tobacco: Never Used  . Alcohol use No    Outpatient Medications Prior to Visit  Medication Sig Dispense Refill  . albuterol (PROVENTIL HFA;VENTOLIN HFA) 108 (90 Base) MCG/ACT inhaler Inhale 2 puffs into the lungs every 6 (six) hours as needed for wheezing or shortness of breath. 1 Inhaler 2  . atorvastatin (LIPITOR) 40 MG tablet TAKE 1 TABLET (40 MG TOTAL) BY MOUTH DAILY. 30 tablet 2  . Blood Glucose Monitoring Suppl (TRUE METRIX METER) w/Device KIT USE AS DIRECTED 1 kit 0  . cholecalciferol (VITAMIN D) 1000 units tablet Take 1,000 Units by mouth daily.    . diclofenac (VOLTAREN) 75 MG EC tablet Take 1 tablet (75 mg total) by mouth 2 (two) times daily. 20 tablet 0  . glimepiride (AMARYL) 4 MG tablet Take 1 tablet (4 mg total) by mouth daily before breakfast. 30 tablet 5  . glucose blood (TRUE  METRIX BLOOD GLUCOSE TEST) test strip 1 each by Other route 3 (three) times daily. 100 each 11  . hydrOXYzine (ATARAX/VISTARIL) 10 MG tablet Take 1 tablet (10 mg total) by mouth 3 (three) times daily as needed. 30 tablet 5  . insulin aspart protamine- aspart (NOVOLOG MIX 70/30) (70-30) 100 UNIT/ML injection Inject 0.4 mLs (40 Units total) into the skin 2 (two) times daily with a meal. 50 mL 3  . JANUVIA 100 MG tablet TAKE 1 TABLET BY MOUTH DAILY 30 tablet 3  . meloxicam (MOBIC) 15 MG tablet Take 1 tablet (15 mg total) by mouth daily. 30 tablet 0  . Multiple Vitamins-Minerals (MULTIVITAL) tablet Take 1 tablet by mouth daily.    Marland Kitchen omega-3 acid ethyl esters (LOVAZA) 1 g capsule TAKE 1 CAPSULE BY MOUTH 2 TIMES DAILY 60 capsule 2  . ramipril (ALTACE) 2.5 MG capsule TAKE 1 CAPSULE BY MOUTH ONCE DAILY 90 capsule 3  . ranitidine (ZANTAC) 150 MG tablet TAKE 2 TABLET BY MOUTH AT BEDTIME. 60 tablet 2  . traZODone (DESYREL) 100 MG tablet Take 1 tablet (100 mg total) by mouth at bedtime as needed for sleep. 30 tablet 3  . traZODone (DESYREL) 100 MG tablet Take 1 tablet (100 mg total) by mouth at bedtime as needed for sleep. 30 tablet 2  . venlafaxine XR (EFFEXOR XR) 75 MG 24 hr capsule Take 1  capsule (75 mg total) by mouth daily with breakfast. 30 capsule 5  . fluconazole (DIFLUCAN) 150 MG tablet Take 1 tablet for yeast infection (Patient not taking: Reported on 07/12/2016) 1 tablet 0   No facility-administered medications prior to visit.     ROS Review of Systems  Constitutional: Negative for chills and fever.  Eyes: Negative for visual disturbance.  Respiratory: Negative for shortness of breath.   Cardiovascular: Negative for chest pain.  Gastrointestinal: Negative for abdominal pain and blood in stool.  Musculoskeletal: Positive for arthralgias. Negative for back pain.  Skin: Negative for rash.  Allergic/Immunologic: Negative for immunocompromised state.  Hematological: Negative for adenopathy. Does  not bruise/bleed easily.  Psychiatric/Behavioral: Negative for dysphoric mood and suicidal ideas.    Objective:  BP 138/78   Pulse 68   Temp 98 F (36.7 C) (Oral)   Ht 5' 2.5" (1.588 m)   Wt 190 lb 6.4 oz (86.4 kg)   SpO2 97%   BMI 34.27 kg/m   BP/Weight 07/12/2016 06/12/2016 3/54/5625  Systolic BP 638 937 342  Diastolic BP 78 65 89  Wt. (Lbs) 190.4 189 189.4  BMI 34.27 34.02 34.09    Physical Exam  Constitutional: She is oriented to person, place, and time. She appears well-developed and well-nourished. No distress.  HENT:  Head: Normocephalic and atraumatic.  Cardiovascular: Intact distal pulses.   Pulmonary/Chest: Effort normal.  Musculoskeletal: She exhibits no edema.       Hands: Neurological: She is alert and oriented to person, place, and time.  Skin: Skin is warm and dry. No rash noted.  Psychiatric: She has a normal mood and affect.    Lab Results  Component Value Date   HGBA1C 8.3 07/12/2016   Lab Results  Component Value Date   HGBA1C 8.9 03/29/2016    CBG 244 Assessment & Plan:  Sara Vasquez was seen today for hand pain.  Diagnoses and all orders for this visit:  Uncontrolled type 2 diabetes mellitus with complication, unspecified long term insulin use status (HCC) -     POCT glucose (manual entry) -     POCT glycosylated hemoglobin (Hb A1C) -     insulin aspart protamine- aspart (NOVOLOG MIX 70/30) (70-30) 100 UNIT/ML injection; Inject 0.5 mLs (50 Units total) into the skin 2 (two) times daily with a meal. -     aspirin EC 81 MG tablet; Take 1 tablet (81 mg total) by mouth daily.  Thumb pain, right -     predniSONE (DELTASONE) 20 MG tablet; Take every morning with food 60 mg daily for 3 days, 40 mg daily for 3 days, 30 mg daily for 3 days, 20 mg daily for 3 days, 10 mg daily for 3 days then STOP    No orders of the defined types were placed in this encounter.   Follow-up: Return in about 3 weeks (around 08/02/2016) for thumb pain .   Boykin Nearing MD

## 2016-07-12 NOTE — Assessment & Plan Note (Addendum)
A: hyperglycemia this AM, however overall improved control with A1c down from 8.9 to 8.3  Med: compliant P: Increase novolog 70/30 to 50 U BID Continue lipitor, add aspirin for primary prevention of CAD and CVA

## 2016-07-12 NOTE — Patient Instructions (Addendum)
Sara Vasquez was seen today for hand pain.  Diagnoses and all orders for this visit:  Uncontrolled type 2 diabetes mellitus with complication, unspecified long term insulin use status (HCC) -     POCT glucose (manual entry) -     POCT glycosylated hemoglobin (Hb A1C) -     insulin aspart protamine- aspart (NOVOLOG MIX 70/30) (70-30) 100 UNIT/ML injection; Inject 0.5 mLs (50 Units total) into the skin 2 (two) times daily with a meal.  Thumb pain, right -     predniSONE (DELTASONE) 20 MG tablet; Take every morning with food 60 mg daily for 3 days, 40 mg daily for 3 days, 30 mg daily for 3 days, 20 mg daily for 3 days, 10 mg daily for 3 days then STOP   Increase novolog 70/30 to 50 U BID Prednisone taper with food for R thumb pain Once you have the orange card please call and I will place a referral to ortho  f/u in 3 weeks for thumb check and blood sugar check  Sara Vasquez

## 2016-07-26 ENCOUNTER — Telehealth: Payer: Self-pay | Admitting: Family Medicine

## 2016-07-26 NOTE — Telephone Encounter (Signed)
Sx's for 2 days.  "Has funny taste in mouth " Hot/ cold chills Sinus drainage, clear. Has non productive cough.  Legs feel shaky and weak like jello. Blood sugar while take on the phone was 242. On yesterday she informs that blood  sugar was 179.  Informed recommended blood sugar is above 77, less than 130.

## 2016-07-26 NOTE — Telephone Encounter (Signed)
Called back to patient ? Is there one particular medication that she takes right before symptoms occur? Asked patient to call back with more information  Symptoms sound viral sinusitis

## 2016-07-26 NOTE — Telephone Encounter (Signed)
Patient called the office asking to speak with nurse in regards to possible medication reaction. Pt stated that she has a weird taste in her mouth and pain in her stomach for the past two days. Please follow up.  Thank you.

## 2016-07-27 ENCOUNTER — Telehealth: Payer: Self-pay

## 2016-07-27 ENCOUNTER — Other Ambulatory Visit: Payer: Self-pay | Admitting: Family Medicine

## 2016-07-27 DIAGNOSIS — E1165 Type 2 diabetes mellitus with hyperglycemia: Principal | ICD-10-CM

## 2016-07-27 DIAGNOSIS — IMO0001 Reserved for inherently not codable concepts without codable children: Secondary | ICD-10-CM

## 2016-07-27 MED FILL — OMEGA-3 ETHYL ESTERS 1 GM C: 1 | 30 days supply | Qty: 60 | Fill #1

## 2016-07-27 MED FILL — GLIMEPIRIDE 4 MG TABLET: 4 | 30 days supply | Qty: 30 | Fill #5

## 2016-07-27 MED FILL — VENLAFAXINE HCL ER 75 MG CA: 75 | 30 days supply | Qty: 30 | Fill #2

## 2016-07-27 MED FILL — ATORVASTATIN 40 MG TABLET: 40 | 30 days supply | Qty: 30 | Fill #2

## 2016-07-27 MED FILL — **JANUVIA 100 MG TABLET: 100 | 28 days supply | Qty: 28 | Fill #1

## 2016-07-27 MED FILL — TRUE METRIX TEST STRIP: 30 days supply | Qty: 100 | Fill #0

## 2016-07-27 MED FILL — traZODone HCL 100 MG TABS: 100 | 30 days supply | Qty: 30 | Fill #1

## 2016-07-27 MED FILL — raNITIdine HCL 150 MG TABS: 150 | 30 days supply | Qty: 60 | Fill #2

## 2016-07-27 MED FILL — BENZONATATE 100 MG CAPSULE: 100 | 15 days supply | Qty: 30 | Fill #1

## 2016-07-27 NOTE — Telephone Encounter (Signed)
Pt contacted the office and stated she received a call from Dr. Armen Pickup and when she realized she called it was after business hours. Pt states her symptoms are constant pt thinks she has a virus and today makes day 3 of this

## 2016-07-28 NOTE — Telephone Encounter (Signed)
Noted. Please inform patient,  For viral symptoms for less than one week the following is recommended You have a viral URI with cough. For this please do the following:  1. Drink plenty of fluids. Hot tea, soup etc will help open your nasal passages. 2. Dextromethorphan 30 mg every 6-8 hrs (plain Robitussin) for cough suppression 3. Tylenol for pain up to 1000 mg three times daily.  4. Nasal saline-OTC nose spray for congestion.   Office visit if symptoms do not improve in one week or there is associated fever, chest pains or trouble breathing

## 2016-07-29 NOTE — Telephone Encounter (Signed)
Contacted pt to go over recommendations that Dr. Armen Pickup noted pt states she understands and doesn't have any questions or concerns

## 2016-08-11 ENCOUNTER — Ambulatory Visit: Payer: Self-pay | Admitting: Family Medicine

## 2016-08-12 ENCOUNTER — Ambulatory Visit: Payer: Self-pay | Attending: Family Medicine

## 2016-09-07 ENCOUNTER — Encounter: Payer: Self-pay | Admitting: Family Medicine

## 2017-03-10 ENCOUNTER — Encounter: Payer: Self-pay | Admitting: Nurse Practitioner

## 2017-03-10 ENCOUNTER — Ambulatory Visit: Payer: Self-pay | Attending: Nurse Practitioner | Admitting: Nurse Practitioner

## 2017-03-10 VITALS — BP 123/70 | HR 66 | Temp 98.3°F | Resp 18 | Ht 62.0 in | Wt 184.2 lb

## 2017-03-10 DIAGNOSIS — R21 Rash and other nonspecific skin eruption: Secondary | ICD-10-CM | POA: Insufficient documentation

## 2017-03-10 DIAGNOSIS — F419 Anxiety disorder, unspecified: Secondary | ICD-10-CM | POA: Insufficient documentation

## 2017-03-10 DIAGNOSIS — F17210 Nicotine dependence, cigarettes, uncomplicated: Secondary | ICD-10-CM | POA: Insufficient documentation

## 2017-03-10 DIAGNOSIS — Z9049 Acquired absence of other specified parts of digestive tract: Secondary | ICD-10-CM | POA: Insufficient documentation

## 2017-03-10 DIAGNOSIS — Z90710 Acquired absence of both cervix and uterus: Secondary | ICD-10-CM | POA: Insufficient documentation

## 2017-03-10 DIAGNOSIS — Z888 Allergy status to other drugs, medicaments and biological substances status: Secondary | ICD-10-CM | POA: Insufficient documentation

## 2017-03-10 DIAGNOSIS — E785 Hyperlipidemia, unspecified: Secondary | ICD-10-CM | POA: Insufficient documentation

## 2017-03-10 DIAGNOSIS — G4700 Insomnia, unspecified: Secondary | ICD-10-CM | POA: Insufficient documentation

## 2017-03-10 DIAGNOSIS — E669 Obesity, unspecified: Secondary | ICD-10-CM | POA: Insufficient documentation

## 2017-03-10 DIAGNOSIS — E1142 Type 2 diabetes mellitus with diabetic polyneuropathy: Secondary | ICD-10-CM | POA: Insufficient documentation

## 2017-03-10 DIAGNOSIS — Z833 Family history of diabetes mellitus: Secondary | ICD-10-CM | POA: Insufficient documentation

## 2017-03-10 DIAGNOSIS — Z8249 Family history of ischemic heart disease and other diseases of the circulatory system: Secondary | ICD-10-CM | POA: Insufficient documentation

## 2017-03-10 DIAGNOSIS — Z6833 Body mass index (BMI) 33.0-33.9, adult: Secondary | ICD-10-CM | POA: Insufficient documentation

## 2017-03-10 DIAGNOSIS — Z23 Encounter for immunization: Secondary | ICD-10-CM | POA: Insufficient documentation

## 2017-03-10 DIAGNOSIS — K219 Gastro-esophageal reflux disease without esophagitis: Secondary | ICD-10-CM | POA: Insufficient documentation

## 2017-03-10 DIAGNOSIS — E1165 Type 2 diabetes mellitus with hyperglycemia: Secondary | ICD-10-CM | POA: Insufficient documentation

## 2017-03-10 DIAGNOSIS — F329 Major depressive disorder, single episode, unspecified: Secondary | ICD-10-CM | POA: Insufficient documentation

## 2017-03-10 DIAGNOSIS — I10 Essential (primary) hypertension: Secondary | ICD-10-CM | POA: Insufficient documentation

## 2017-03-10 DIAGNOSIS — M1990 Unspecified osteoarthritis, unspecified site: Secondary | ICD-10-CM | POA: Insufficient documentation

## 2017-03-10 LAB — GLUCOSE, POCT (MANUAL RESULT ENTRY): POC Glucose: 234 mg/dl — AB (ref 70–99)

## 2017-03-10 LAB — POCT GLYCOSYLATED HEMOGLOBIN (HGB A1C): HEMOGLOBIN A1C: 10.4

## 2017-03-10 MED ORDER — NYSTATIN 100000 UNIT/GM EX CREA: 1.0000 "application " | TOPICAL_CREAM | Freq: Two times a day (BID) | CUTANEOUS | 0 refills | Status: AC

## 2017-03-10 MED ORDER — PAROXETINE HCL 20 MG PO TABS: 20.0000 mg | ORAL_TABLET | Freq: Every day | ORAL | 1 refills | Status: DC

## 2017-03-10 MED ORDER — RANITIDINE HCL 150 MG PO TABS: ORAL_TABLET | ORAL | 2 refills | Status: DC

## 2017-03-10 MED ORDER — RAMIPRIL 2.5 MG PO CAPS: 2.5000 mg | ORAL_CAPSULE | Freq: Every day | ORAL | 3 refills | Status: AC

## 2017-03-10 MED ORDER — GLIMEPIRIDE 4 MG PO TABS: 8.0000 mg | ORAL_TABLET | Freq: Every day | ORAL | 0 refills | Status: AC

## 2017-03-10 MED ORDER — SITAGLIPTIN PHOSPHATE 100 MG PO TABS: 100.0000 mg | ORAL_TABLET | Freq: Every day | ORAL | 3 refills | Status: AC

## 2017-03-10 MED ORDER — INSULIN ASPART PROT & ASPART (70-30 MIX) 100 UNIT/ML ~~LOC~~ SUSP: 50.0000 [IU] | Freq: Two times a day (BID) | SUBCUTANEOUS | 3 refills | Status: AC

## 2017-03-10 MED ORDER — TRAZODONE HCL 100 MG PO TABS: 100.0000 mg | ORAL_TABLET | Freq: Every evening | ORAL | 2 refills | Status: AC | PRN

## 2017-03-10 MED FILL — **JANUVIA 100 MG TABLET: 100 | 7 days supply | Qty: 7 | Fill #0

## 2017-03-10 MED FILL — traZODone HCL 100 MG TABS: 100 | 30 days supply | Qty: 30 | Fill #0

## 2017-03-10 MED FILL — RAMIPRIL 2.5 MG CAPS: 2.5 | 30 days supply | Qty: 30 | Fill #0

## 2017-03-10 MED FILL — GLIMEPIRIDE 4 MG TABLET: 4 | 30 days supply | Qty: 60 | Fill #0

## 2017-03-10 MED FILL — $NOVOLOG MIX 70/30 VIAL: (70-30) 100 | 20 days supply | Qty: 10 | Fill #0

## 2017-03-10 MED FILL — raNITIdine HCL 150 MG TABS: 150 | 30 days supply | Qty: 60 | Fill #0

## 2017-03-10 MED FILL — PARoxetine HCL 20 MG TABS: 20 | 30 days supply | Qty: 30 | Fill #0

## 2017-03-10 MED FILL — NYSTATIN 100,000 UNIT/GM CR: 100000 | 15 days supply | Qty: 30 | Fill #0

## 2017-03-10 NOTE — Patient Instructions (Addendum)
Blood Glucose Monitoring, Adult Monitoring your blood sugar (glucose) helps you manage your diabetes. It also helps you and your health care provider determine how well your diabetes management plan is working. Blood glucose monitoring involves checking your blood glucose as often as directed, and keeping a record (log) of your results over time. Why should I monitor my blood glucose? Checking your blood glucose regularly can:  Help you understand how food, exercise, illnesses, and medicines affect your blood glucose.  Let you know what your blood glucose is at any time. You can quickly tell if you are having low blood glucose (hypoglycemia) or high blood glucose (hyperglycemia).  Help you and your health care provider adjust your medicines as needed.  When should I check my blood glucose? Follow instructions from your health care provider about how often to check your blood glucose. This may depend on:  The type of diabetes you have.  How well-controlled your diabetes is.  Medicines you are taking.  If you have type 1 diabetes:  Check your blood glucose at least 2 times a day.  Also check your blood glucose: ? Before every insulin injection. ? Before and after exercise. ? Between meals. ? 2 hours after a meal. ? Occasionally between 2:00 a.m. and 3:00 a.m., as directed. ? Before potentially dangerous tasks, like driving or using heavy machinery. ? At bedtime.  You may need to check your blood glucose more often, up to 6-10 times a day: ? If you use an insulin pump. ? If you need multiple daily injections (MDI). ? If your diabetes is not well-controlled. ? If you are ill. ? If you have a history of severe hypoglycemia. ? If you have a history of not knowing when your blood glucose is getting low (hypoglycemia unawareness). If you have type 2 diabetes:  If you take insulin or other diabetes medicines, check your blood glucose at least 2 times a day.  If you are on intensive  insulin therapy, check your blood glucose at least 4 times a day. Occasionally, you may also need to check between 2:00 a.m. and 3:00 a.m., as directed.  Also check your blood glucose: ? Before and after exercise. ? Before potentially dangerous tasks, like driving or using heavy machinery.  You may need to check your blood glucose more often if: ? Your medicine is being adjusted. ? Your diabetes is not well-controlled. ? You are ill. What is a blood glucose log?  A blood glucose log is a record of your blood glucose readings. It helps you and your health care provider: ? Look for patterns in your blood glucose over time. ? Adjust your diabetes management plan as needed.  Every time you check your blood glucose, write down your result and notes about things that may be affecting your blood glucose, such as your diet and exercise for the day.  Most glucose meters store a record of glucose readings in the meter. Some meters allow you to download your records to a computer. How do I check my blood glucose? Follow these steps to get accurate readings of your blood glucose: Supplies needed   Blood glucose meter.  Test strips for your meter. Each meter has its own strips. You must use the strips that come with your meter.  A needle to prick your finger (lancet). Do not use lancets more than once.  A device that holds the lancet (lancing device).  A journal or log book to write down your results. Procedure  Wash your hands with soap and water.  Prick the side of your finger (not the tip) with the lancet. Use a different finger each time.  Gently rub the finger until a small drop of blood appears.  Follow instructions that come with your meter for inserting the test strip, applying blood to the strip, and using your blood glucose meter.  Write down your result and any notes. Alternative testing sites  Some meters allow you to use areas of your body other than your finger  (alternative sites) to test your blood.  If you think you may have hypoglycemia, or if you have hypoglycemia unawareness, do not use alternative sites. Use your finger instead.  Alternative sites may not be as accurate as the fingers, because blood flow is slower in these areas. This means that the result you get may be delayed, and it may be different from the result that you would get from your finger.  The most common alternative sites are: ? Forearm. ? Thigh. ? Palm of the hand. Additional tips  Always keep your supplies with you.  If you have questions or need help, all blood glucose meters have a 24-hour "hotline" number that you can call. You may also contact your health care provider.  After you use a few boxes of test strips, adjust (calibrate) your blood glucose meter by following instructions that came with your meter. This information is not intended to replace advice given to you by your health care provider. Make sure you discuss any questions you have with your health care provider. Document Released: 04/14/2003 Document Revised: 10/30/2015 Document Reviewed: 09/21/2015 Elsevier Interactive Patient Education  2017 [employer]  DASH Eating Plan DASH stands for "Dietary Approaches to Stop Hypertension." The DASH eating plan is a healthy eating plan that has been shown to reduce high blood pressure (hypertension). It may also reduce your risk for type 2 diabetes, heart disease, and stroke. The DASH eating plan may also help with weight loss. What are tips for following this plan? General guidelines  Avoid eating more than 2,300 mg (milligrams) of salt (sodium) a day. If you have hypertension, you may need to reduce your sodium intake to 1,500 mg a day.  Limit alcohol intake to no more than 1 drink a day for nonpregnant women and 2 drinks a day for men. One drink equals 12 oz of beer, 5 oz of wine, or 1 oz of hard liquor.  Work with your health care provider to maintain a  healthy body weight or to lose weight. Ask what an ideal weight is for you.  Get at least 30 minutes of exercise that causes your heart to beat faster (aerobic exercise) most days of the week. Activities may include walking, swimming, or biking.  Work with your health care provider or diet and nutrition specialist (dietitian) to adjust your eating plan to your individual calorie needs. Reading food labels  Check food labels for the amount of sodium per serving. Choose foods with less than 5 percent of the Daily Value of sodium. Generally, foods with less than 300 mg of sodium per serving fit into this eating plan.  To find whole grains, look for the word "whole" as the first word in the ingredient list. Shopping  Buy products labeled as "low-sodium" or "no salt added."  Buy fresh foods. Avoid canned foods and premade or frozen meals. Cooking  Avoid adding salt when cooking. Use salt-free seasonings or herbs instead of table salt or sea salt.  Check with your health care provider or pharmacist before using salt substitutes.  Do not fry foods. Cook foods using healthy methods such as baking, boiling, grilling, and broiling instead.  Cook with heart-healthy oils, such as olive, canola, soybean, or sunflower oil. Meal planning   Eat a balanced diet that includes: ? 5 or more servings of fruits and vegetables each day. At each meal, try to fill half of your plate with fruits and vegetables. ? Up to 6-8 servings of whole grains each day. ? Less than 6 oz of lean meat, poultry, or fish each day. A 3-oz serving of meat is about the same size as a deck of cards. One egg equals 1 oz. ? 2 servings of low-fat dairy each day. ? A serving of nuts, seeds, or beans 5 times each week. ? Heart-healthy fats. Healthy fats called Omega-3 fatty acids are found in foods such as flaxseeds and coldwater fish, like sardines, salmon, and mackerel.  Limit how much you eat of the following: ? Canned or  prepackaged foods. ? Food that is high in trans fat, such as fried foods. ? Food that is high in saturated fat, such as fatty meat. ? Sweets, desserts, sugary drinks, and other foods with added sugar. ? Full-fat dairy products.  Do not salt foods before eating.  Try to eat at least 2 vegetarian meals each week.  Eat more home-cooked food and less restaurant, buffet, and fast food.  When eating at a restaurant, ask that your food be prepared with less salt or no salt, if possible. What foods are recommended? The items listed may not be a complete list. Talk with your dietitian about what dietary choices are best for you. Grains Whole-grain or whole-wheat bread. Whole-grain or whole-wheat pasta. Brown rice. Orpah Cobbatmeal. Quinoa. Bulgur. Whole-grain and low-sodium cereals. Pita bread. Low-fat, low-sodium crackers. Whole-wheat flour tortillas. Vegetables Fresh or frozen vegetables (raw, steamed, roasted, or grilled). Low-sodium or reduced-sodium tomato and vegetable juice. Low-sodium or reduced-sodium tomato sauce and tomato paste. Low-sodium or reduced-sodium canned vegetables. Fruits All fresh, dried, or frozen fruit. Canned fruit in natural juice (without added sugar). Meat and other protein foods Skinless chicken or [place]urkey. Ground chicken or [place]key. Pork with fat trimmed off. Fish and seafood. Egg whites. Dried beans, peas, or lentils. Unsalted nuts, nut butters, and seeds. Unsalted canned beans. Lean cuts of beef with fat trimmed off. Low-sodium, lean deli meat. Dairy Low-fat (1%) or fat-free (skim) milk. Fat-free, low-fat, or reduced-fat cheeses. Nonfat, low-sodium ricotta or cottage cheese. Low-fat or nonfat yogurt. Low-fat, low-sodium cheese. Fats and oils Soft margarine without trans fats. Vegetable oil. Low-fat, reduced-fat, or light mayonnaise and salad dressings (reduced-sodium). Canola, safflower, olive, soybean, and sunflower oils. Avocado. Seasoning and other foods Herbs. Spices.  Seasoning mixes without salt. Unsalted popcorn and pretzels. Fat-free sweets. What foods are not recommended? The items listed may not be a complete list. Talk with your dietitian about what dietary choices are best for you. Grains Baked goods made with fat, such as croissants, muffins, or some breads. Dry pasta or rice meal packs. Vegetables Creamed or fried vegetables. Vegetables in a cheese sauce. Regular canned vegetables (not low-sodium or reduced-sodium). Regular canned tomato sauce and paste (not low-sodium or reduced-sodium). Regular tomato and vegetable juice (not low-sodium or reduced-sodium). Rosita FirePickles. Olives. Fruits Canned fruit in a light or heavy syrup. Fried fruit. Fruit in cream or butter sauce. Meat and other protein foods Fatty cuts of meat. Ribs. Fried meat. Tomasa BlaseBacon. Sausage. Bologna and other  processed lunch meats. Salami. Fatback. Hotdogs. Bratwurst. Salted nuts and seeds. Canned beans with added salt. Canned or smoked fish. Whole eggs or egg yolks. Chicken or [place]key with skin. Dairy Whole or 2% milk, cream, and half-and-half. Whole or full-fat cream cheese. Whole-fat or sweetened yogurt. Full-fat cheese. Nondairy creamers. Whipped toppings. Processed cheese and cheese spreads. Fats and oils Butter. Stick margarine. Lard. Shortening. Ghee. Bacon fat. Tropical oils, such as coconut, palm kernel, or palm oil. Seasoning and other foods Salted popcorn and pretzels. Onion salt, garlic salt, seasoned salt, table salt, and sea salt. Worcestershire sauce. Tartar sauce. Barbecue sauce. Teriyaki sauce. Soy sauce, including reduced-sodium. Steak sauce. Canned and packaged gravies. Fish sauce. Oyster sauce. Cocktail sauce. Horseradish that you find on the shelf. Ketchup. Mustard. Meat flavorings and tenderizers. Bouillon cubes. Hot sauce and Tabasco sauce. Premade or packaged marinades. Premade or packaged taco seasonings. Relishes. Regular salad dressings. Where to find more  information:  National Heart, Lung, and Blood Institute: PopSteam.iswww.nhlbi.nih.gov  American Heart Association: www.heart.org Summary  The DASH eating plan is a healthy eating plan that has been shown to reduce high blood pressure (hypertension). It may also reduce your risk for type 2 diabetes, heart disease, and stroke.  With the DASH eating plan, you should limit salt (sodium) intake to 2,300 mg a day. If you have hypertension, you may need to reduce your sodium intake to 1,500 mg a day.  When on the DASH eating plan, aim to eat more fresh fruits and vegetables, whole grains, lean proteins, low-fat dairy, and heart-healthy fats.  Work with your health care provider or diet and nutrition specialist (dietitian) to adjust your eating plan to your individual calorie needs. This information is not intended to replace advice given to you by your health care provider. Make sure you discuss any questions you have with your health care provider. Document Released: 03/31/2011 Document Revised: 04/04/2016 Document Reviewed: 04/04/2016 Elsevier Interactive Patient Education  2017 Elsevier Inc.  Generalized Anxiety Disorder, Adult Generalized anxiety disorder (GAD) is a mental health disorder. People with this condition constantly worry about everyday events. Unlike normal anxiety, worry related to GAD is not triggered by a specific event. These worries also do not fade or get better with time. GAD interferes with life functions, including relationships, work, and school. GAD can vary from mild to severe. People with severe GAD can have intense waves of anxiety with physical symptoms (panic attacks). What are the causes? The exact cause of GAD is not known. What increases the risk? This condition is more likely to develop in:  Women.  People who have a family history of anxiety disorders.  People who are very shy.  People who experience very stressful life events, such as the death of a loved  one.  People who have a very stressful family environment.  What are the signs or symptoms? People with GAD often worry excessively about many things in their lives, such as their health and family. They may also be overly concerned about:  Doing well at work.  Being on time.  Natural disasters.  Friendships.  Physical symptoms of GAD include:  Fatigue.  Muscle tension or having muscle twitches.  Trembling or feeling shaky.  Being easily startled.  Feeling like your heart is pounding or racing.  Feeling out of breath or like you cannot take a deep breath.  Having trouble falling asleep or staying asleep.  Sweating.  Nausea, diarrhea, or irritable bowel syndrome (IBS).  Headaches.  Trouble concentrating or  remembering facts.  Restlessness.  Irritability.  How is this diagnosed? Your health care provider can diagnose GAD based on your symptoms and medical history. You will also have a physical exam. The health care provider will ask specific questions about your symptoms, including how severe they are, when they started, and if they come and go. Your health care provider may ask you about your use of alcohol or drugs, including prescription medicines. Your health care provider may refer you to a mental health specialist for further evaluation. Your health care provider will do a thorough examination and may perform additional tests to rule out other possible causes of your symptoms. To be diagnosed with GAD, a person must have anxiety that:  Is out of his or her control.  Affects several different aspects of his or her life, such as work and relationships.  Causes distress that makes him or her unable to take part in normal activities.  Includes at least three physical symptoms of GAD, such as restlessness, fatigue, trouble concentrating, irritability, muscle tension, or sleep problems.  Before your health care provider can confirm a diagnosis of GAD, these  symptoms must be present more days than they are not, and they must last for six months or longer. How is this treated? The following therapies are usually used to treat GAD:  Medicine. Antidepressant medicine is usually prescribed for long-term daily control. Antianxiety medicines may be added in severe cases, especially when panic attacks occur.  Talk therapy (psychotherapy). Certain types of talk therapy can be helpful in treating GAD by providing support, education, and guidance. Options include: ? Cognitive behavioral therapy (CBT). People learn coping skills and techniques to ease their anxiety. They learn to identify unrealistic or negative thoughts and behaviors and to replace them with positive ones. ? Acceptance and commitment therapy (ACT). This treatment teaches people how to be mindful as a way to cope with unwanted thoughts and feelings. ? Biofeedback. This process trains you to manage your body's response (physiological response) through breathing techniques and relaxation methods. You will work with a therapist while machines are used to monitor your physical symptoms.  Stress management techniques. These include yoga, meditation, and exercise.  A mental health specialist can help determine which treatment is best for you. Some people see improvement with one type of therapy. However, other people require a combination of therapies. Follow these instructions at home:  Take over-the-counter and prescription medicines only as told by your health care provider.  Try to maintain a normal routine.  Try to anticipate stressful situations and allow extra time to manage them.  Practice any stress management or self-calming techniques as taught by your health care provider.  Do not punish yourself for setbacks or for not making progress.  Try to recognize your accomplishments, even if they are small.  Keep all follow-up visits as told by your health care provider. This is  important. Contact a health care provider if:  Your symptoms do not get better.  Your symptoms get worse.  You have signs of depression, such as: ? A persistently sad, cranky, or irritable mood. ? Loss of enjoyment in activities that used to bring you joy. ? Change in weight or eating. ? Changes in sleeping habits. ? Avoiding friends or family members. ? Loss of energy for normal tasks. ? Feelings of guilt or worthlessness. Get help right away if:  You have serious thoughts about hurting yourself or others. If you ever feel like you may hurt yourself or  others, or have thoughts about taking your own life, get help right away. You can go to your nearest emergency department or call:  Your local emergency services (911 in the [place]  A suicide crisis helpline, such as the National Suicide Prevention Lifeline at 6576828501. This is open 24 hours a day.  Summary  Generalized anxiety disorder (GAD) is a mental health disorder that involves worry that is not triggered by a specific event.  People with GAD often worry excessively about many things in their lives, such as their health and family.  GAD may cause physical symptoms such as restlessness, trouble concentrating, sleep problems, frequent sweating, nausea, diarrhea, headaches, and trembling or muscle twitching.  A mental health specialist can help determine which treatment is best for you. Some people see improvement with one type of therapy. However, other people require a combination of therapies. This information is not intended to replace advice given to you by your health care provider. Make sure you discuss any questions you have with your health care provider. Document Released: 08/06/2012 Document Revised: 03/01/2016 Document Reviewed: 03/01/2016 Elsevier Interactive Patient Education  2018 [employer].  Hypertension Hypertension is another name for high blood pressure. High blood pressure forces your heart to  work harder to pump blood. This can cause problems over time. There are two numbers in a blood pressure reading. There is a top number (systolic) over a bottom number (diastolic). It is best to have a blood pressure below 120/80. Healthy choices can help lower your blood pressure. You may need medicine to help lower your blood pressure if:  Your blood pressure cannot be lowered with healthy choices.  Your blood pressure is higher than 130/80.  Follow these instructions at home: Eating and drinking  If directed, follow the DASH eating plan. This diet includes: ? Filling half of your plate at each meal with fruits and vegetables. ? Filling one quarter of your plate at each meal with whole grains. Whole grains include whole wheat pasta, brown rice, and whole grain bread. ? Eating or drinking low-fat dairy products, such as skim milk or low-fat yogurt. ? Filling one quarter of your plate at each meal with low-fat (lean) proteins. Low-fat proteins include fish, skinless chicken, eggs, beans, and tofu. ? Avoiding fatty meat, cured and processed meat, or chicken with skin. ? Avoiding premade or processed food.  Eat less than 1,500 mg of salt (sodium) a day.  Limit alcohol use to no more than 1 drink a day for nonpregnant women and 2 drinks a day for men. One drink equals 12 oz of beer, 5 oz of wine, or 1 oz of hard liquor. Lifestyle  Work with your doctor to stay at a healthy weight or to lose weight. Ask your doctor what the best weight is for you.  Get at least 30 minutes of exercise that causes your heart to beat faster (aerobic exercise) most days of the week. This may include walking, swimming, or biking.  Get at least 30 minutes of exercise that strengthens your muscles (resistance exercise) at least 3 days a week. This may include lifting weights or pilates.  Do not use any products that contain nicotine or tobacco. This includes cigarettes and e-cigarettes. If you need help quitting,  ask your doctor.  Check your blood pressure at home as told by your doctor.  Keep all follow-up visits as told by your doctor. This is important. Medicines  Take over-the-counter and prescription medicines only as told by your doctor.  Follow directions carefully.  Do not skip doses of blood pressure medicine. The medicine does not work as well if you skip doses. Skipping doses also puts you at risk for problems.  Ask your doctor about side effects or reactions to medicines that you should watch for. Contact a doctor if:  You think you are having a reaction to the medicine you are taking.  You have headaches that keep coming back (recurring).  You feel dizzy.  You have swelling in your ankles.  You have trouble with your vision. Get help right away if:  You get a very bad headache.  You start to feel confused.  You feel weak or numb.  You feel faint.  You get very bad pain in your: ? Chest. ? Belly (abdomen).  You throw up (vomit) more than once.  You have trouble breathing. Summary  Hypertension is another name for high blood pressure.  Making healthy choices can help lower blood pressure. If your blood pressure cannot be controlled with healthy choices, you may need to take medicine. This information is not intended to replace advice given to you by your health care provider. Make sure you discuss any questions you have with your health care provider. Document Released: 09/28/2007 Document Revised: 03/09/2016 Document Reviewed: 03/09/2016 Elsevier Interactive Patient Education  [employer].

## 2017-03-10 NOTE — Progress Notes (Signed)
Assessment & Plan:  Sara Vasquez was seen today for establish care.  Diagnoses and all orders for this visit:  Uncontrolled type 2 diabetes mellitus with hyperglycemia (HCC) -     Glucose (CBG) -     HgB A1c -     Flu Vaccine QUAD 6+ mos PF IM (Fluarix Quad PF) -     glimepiride (AMARYL) 4 MG tablet; Take 2 tablets (8 mg total) daily before breakfast by mouth. -     Microalbumin / creatinine urine ratio -     CMP14+EGFR -     Lipid panel -     CBC -     sitaGLIPtin (JANUVIA) 100 MG tablet; Take 1 tablet (100 mg total) daily by mouth. -     insulin aspart protamine- aspart (NOVOLOG MIX 70/30) (70-30) 100 UNIT/ML injection; Inject 0.5 mLs (50 Units total) 2 (two) times daily with a meal into the skin.  Essential hypertension, benign -     ramipril (ALTACE) 2.5 MG capsule; Take 1 capsule (2.5 mg total) daily by mouth. Check blood pressure daily at the same time. Keep a log of all blood pressure readings.  DASH DIET; No salt or low sodium diet  If pressure if greater than 150/100 notify me here at the office.  If it's persistently greater 180/90, go to the Emergency Department.  Take all medication as prescribed.   Avoid smoked meats which are high in sodium content.  DASH DIET  Immunization due -     Flu Vaccine QUAD 6+ mos PF IM (Fluarix Quad PF)  Anxiety and depression -     PARoxetine (PAXIL) 20 MG tablet; Take 1 tablet (20 mg total) daily by mouth. -     traZODone (DESYREL) 100 MG tablet; Take 1 tablet (100 mg total) at bedtime as needed by mouth for sleep. DENIES SI/HI Follow up 3 weeks  Rash of genital area -     Urine cytology ancillary only -     nystatin cream (MYCOSTATIN); Apply 1 application 2 (two) times daily topically.  Gastroesophageal reflux disease, esophagitis presence not specified -     ranitidine (ZANTAC) 150 MG tablet; TAKE 2 TABLET BY MOUTH AT BEDTIME.      Subjective:   Chief Complaint  Patient presents with  . Establish Care    Patient  stated that she is not able to get sleep.    Hypertension Patient here for follow-up of hypertension. She is not exercising and is not adherent to low salt diet.  She does have her BP log  today.  Patient denies chest pain, chest pressure/discomfort, claudication, dyspnea, exertional chest pressure/discomfort, lower extremity edema, palpitations, paroxysmal nocturnal dyspnea and syncope.  Cardiovascular risk factors: diabetes mellitus, dyslipidemia, family history of premature cardiovascular disease, hypertension, obesity (BMI >= 30 kg/m2) and smoking/ tobacco exposure. Use of agents associated with hypertension: none.  History of target organ damage: none. BP Readings from Last 3 Encounters: 03/10/17 : 123/70 07/12/16 : 138/78 06/12/16 : (!) 143/65   Hyperlipidemia Patient presents for follow up to hyperlipidemia.  She is not medication or diet compliant and denies chest pain, lower extremity edema and palpitations or statin intolerance including myalgias.  Lab Results      Component                Value               Date  CHOL                     277 (H)             02/04/2015                CHOL                     297 (H)             07/03/2014                CHOL                     321 (H)             12/19/2013           Lab Results      Component                Value               Date                      HDL                      35 (L)              02/04/2015                HDL                      46                  07/03/2014                HDL                      42                  12/19/2013           Lab Results      Component                Value               Date                      LDLCALC                  NOT CALC            02/04/2015                LDLCALC                  175 (H)             07/03/2014                LDLCALC                  NOT CALC            12/19/2013           Lab Results      Component                Value  Date                      TRIG                     427 (H)             02/04/2015                TRIG                     378 (H)             07/03/2014                TRIG                     664 (H)             12/19/2013           Lab Results      Component                Value               Date                      CHOLHDL                  7.9 (H)             02/04/2015                CHOLHDL                  6.5                 07/03/2014                CHOLHDL                  7.6                 12/19/2013           No results found for: LDLDIRECT  Diabetes Mellitus Type II Patient here for follow-up of Type 2 diabetes mellitus.  Current symptoms/problems include hyperglycemia and paresthesia of the feet and have been stable.  Known diabetic complications: peripheral neuropathy and cardiovascular disease Current diabetic medications include: Januvia and Amaryl '4mg'$  (she reports an intolerance to metformin; drowsiness) Novolog 70/30 (50units BID) Eye exam current (within one year): NO-financial limitations Weight trend: stable Current monitoring regimen: none and noncompliant Is She on ACE inhibitor or angiotensin II receptor blocker?  Yes  Lab Results      Component                Value               Date                      HGBA1C                   10.4                03/10/2017                HGBA1C                   8.3  07/12/2016                HGBA1C                   8.9                 03/29/2016                 Insomnia Taking Trazodone '100mg'$  at bedtime. Reports ineffective. Will start Paxil and see if this will help with insomnia. She is reluctant to increase the trazodone at this time. She states "I don't want to get too sleepy and not hear my mom if she needs me at night".   Depression: Patient complains of depression. She complains of anhedonia, depressed mood, difficulty concentrating, fatigue, feelings of worthlessness/guilt, insomnia and  weight loss. Onset was approximately a few weeks ago, gradually worsening since that time.  She denies current suicidal and homicidal plan or intent.   Family history significant for heart disease.Possible organic causes contributing are: endocrine/metabolic, nutritional.  Risk factors: negative life event stepfather recently passed Previous treatment includes Effexor. She complains of unwanted side effects from Effexor.    Past Medical History:  Diagnosis Date  . Anxiety   . Arthritis   . Depression   . Diabetes mellitus without complication ([place])   . Hypertension     Past Surgical History:  Procedure Laterality Date  . ABDOMINAL HYSTERECTOMY    . BACK SURGERY    . CHOLECYSTECTOMY      Family History  Problem Relation Age of Onset  . Diabetes Mother   . Heart disease Mother   . Hypertension Father   . Heart disease Father   . Cancer Paternal Aunt        lung cancer  . Cancer Maternal Grandmother        breast cancer  . Diabetes Maternal Grandfather     Social History   Socioeconomic History  . Marital status: Married    Spouse name: Not on file  . Number of children: Not on file  . Years of education: Not on file  . Highest education level: Not on file  Social Needs  . Financial resource strain: Not on file  . Food insecurity - worry: Not on file  . Food insecurity - inability: Not on file  . Transportation needs - medical: Not on file  . Transportation needs - non-medical: Not on file  Occupational History  . Not on file  Tobacco Use  . Smoking status: Current Every Day Smoker    Packs/day: 1.00    Years: 35.00    Pack years: 35.00    Types: Cigarettes    Start date: 03/27/2014  . Smokeless tobacco: Never Used  Substance and Sexual Activity  . Alcohol use: No    Alcohol/week: 0.0 oz  . Drug use: No  . Sexual activity: Not on file  Other Topics Concern  . Not on file  Social History Narrative  . Not on file    Outpatient Medications Prior to Visit    Medication Sig Dispense Refill  . albuterol (PROVENTIL HFA;VENTOLIN HFA) 108 (90 Base) MCG/ACT inhaler Inhale 2 puffs into the lungs every 6 (six) hours as needed for wheezing or shortness of breath. (Patient not taking: Reported on 03/10/2017) 1 Inhaler 2  . aspirin EC 81 MG tablet Take 1 tablet (81 mg total) by mouth daily. (Patient not taking: Reported on 03/10/2017) 30 tablet 11  . atorvastatin (LIPITOR) 40  MG tablet TAKE 1 TABLET (40 MG TOTAL) BY MOUTH DAILY. (Patient not taking: Reported on 03/10/2017) 30 tablet 2  . Blood Glucose Monitoring Suppl (TRUE METRIX METER) w/Device KIT USE AS DIRECTED (Patient not taking: Reported on 03/10/2017) 1 kit 0  . cholecalciferol (VITAMIN D) 1000 units tablet Take 1,000 Units by mouth daily.    Marland Kitchen glucose blood test strip USE AS DIRECTED 3 TIMES DAILY (Patient not taking: Reported on 03/10/2017) 100 each 11  . hydrOXYzine (ATARAX/VISTARIL) 10 MG tablet Take 1 tablet (10 mg total) by mouth 3 (three) times daily as needed. (Patient not taking: Reported on 03/10/2017) 30 tablet 5  . Multiple Vitamins-Minerals (MULTIVITAL) tablet Take 1 tablet by mouth daily.    Marland Kitchen omega-3 acid ethyl esters (LOVAZA) 1 g capsule TAKE 1 CAPSULE BY MOUTH 2 TIMES DAILY (Patient not taking: Reported on 03/10/2017) 60 capsule 2  . glimepiride (AMARYL) 4 MG tablet Take 1 tablet (4 mg total) by mouth daily before breakfast. (Patient not taking: Reported on 03/10/2017) 30 tablet 5  . insulin aspart protamine- aspart (NOVOLOG MIX 70/30) (70-30) 100 UNIT/ML injection Inject 0.5 mLs (50 Units total) into the skin 2 (two) times daily with a meal. (Patient not taking: Reported on 03/10/2017) 50 mL 3  . JANUVIA 100 MG tablet TAKE 1 TABLET BY MOUTH DAILY (Patient not taking: Reported on 03/10/2017) 30 tablet 3  . predniSONE (DELTASONE) 20 MG tablet Take every morning with food 60 mg daily for 3 days, 40 mg daily for 3 days, 30 mg daily for 3 days, 20 mg daily for 3 days, 10 mg daily for 3 days  then STOP (Patient not taking: Reported on 03/10/2017) 24 tablet 0  . ramipril (ALTACE) 2.5 MG capsule TAKE 1 CAPSULE BY MOUTH ONCE DAILY (Patient not taking: Reported on 03/10/2017) 90 capsule 3  . ranitidine (ZANTAC) 150 MG tablet TAKE 2 TABLET BY MOUTH AT BEDTIME. (Patient not taking: Reported on 03/10/2017) 60 tablet 2  . traZODone (DESYREL) 100 MG tablet Take 1 tablet (100 mg total) by mouth at bedtime as needed for sleep. (Patient not taking: Reported on 03/10/2017) 30 tablet 2  . venlafaxine XR (EFFEXOR XR) 75 MG 24 hr capsule Take 1 capsule (75 mg total) by mouth daily with breakfast. (Patient not taking: Reported on 03/10/2017) 30 capsule 5   No facility-administered medications prior to visit.     Allergies  Allergen Reactions  . Fentanyl   . Metformin And Related     Mood swings  . Phenergan [Promethazine Hcl]     Review of Systems  Constitutional: Negative for fever, malaise/fatigue and weight loss.  HENT: Negative.  Negative for nosebleeds.   Eyes: Negative.  Negative for blurred vision, double vision and photophobia.  Respiratory: Negative.  Negative for cough and shortness of breath.   Cardiovascular: Negative.  Negative for chest pain, palpitations and leg swelling.  Gastrointestinal: Negative.  Negative for abdominal pain, constipation, diarrhea, heartburn, nausea and vomiting.  Genitourinary:       Vaginal itching and redness. Denies odor or discharge.   Musculoskeletal: Negative.  Negative for myalgias.  Neurological: Negative.  Negative for dizziness, focal weakness, seizures and headaches.  Endo/Heme/Allergies: Negative for environmental allergies.  Psychiatric/Behavioral: Positive for depression. Negative for suicidal ideas. The patient is nervous/anxious and has insomnia.        Objective:    Physical Exam  Constitutional: She is oriented to person, place, and time. She appears well-developed and well-nourished. She is cooperative.  HENT:  Head:  Normocephalic and atraumatic.  Eyes: EOM are normal.  Neck: Normal range of motion.  Cardiovascular: Normal rate, regular rhythm, normal heart sounds and intact distal pulses. Exam reveals no gallop and no friction rub.  No murmur heard. Pulmonary/Chest: Effort normal and breath sounds normal. No tachypnea. No respiratory distress. She has no decreased breath sounds. She has no wheezes. She has no rhonchi. She has no rales. She exhibits no tenderness.  Abdominal: Soft. Bowel sounds are normal.  Musculoskeletal: Normal range of motion. She exhibits no edema.  Neurological: She is alert and oriented to person, place, and time. Coordination normal.  Skin: Skin is warm and dry.  Psychiatric: She has a normal mood and affect. Her behavior is normal. Judgment and thought content normal. She expresses no homicidal and no suicidal ideation. She expresses no suicidal plans and no homicidal plans.  Nursing note and vitals reviewed.   BP 123/70 (BP Location: Right Arm, Patient Position: Sitting, Cuff Size: Normal)   Pulse 66   Temp 98.3 F (36.8 C) (Oral)   Resp 18   Ht '5\' 2"'$  (1.575 m)   Wt 184 lb 3.2 oz (83.6 kg)   SpO2 96%   BMI 33.69 kg/m  Wt Readings from Last 3 Encounters:  03/10/17 184 lb 3.2 oz (83.6 kg)  07/12/16 190 lb 6.4 oz (86.4 kg)  06/12/16 189 lb (85.7 kg)        Patient has been counseled extensively about nutrition and exercise as well as the importance of adherence with medications and regular follow-up. The patient was given clear instructions to go to ER or return to medical center if symptoms don't improve, worsen or new problems develop. The patient verbalized understanding.   Follow-up: Return in about 3 weeks (around 03/31/2017) for depression, insomnia; needs 30 minutes.   Gildardo Pounds, FNP-BC [facility] and [place], [place]   03/10/2017, 5:47 PM

## 2017-03-11 LAB — LIPID PANEL
CHOL/HDL RATIO: 8.9 ratio — AB (ref 0.0–4.4)
Cholesterol, Total: 363 mg/dL — ABNORMAL HIGH (ref 100–199)
HDL: 41 mg/dL (ref >39)
TRIGLYCERIDES: 790 mg/dL — AB (ref 0–149)

## 2017-03-11 LAB — CBC
Hematocrit: 46.1 % (ref 34.0–46.6)
Hemoglobin: 16 g/dL — ABNORMAL HIGH (ref 11.1–15.9)
MCH: 30.1 pg (ref 26.6–33.0)
MCHC: 34.7 g/dL (ref 31.5–35.7)
MCV: 87 fL (ref 79–97)
Platelets: 335 10*3/uL (ref 150–379)
RBC: 5.31 x10E6/uL — ABNORMAL HIGH (ref 3.77–5.28)
RDW: 14 % (ref 12.3–15.4)
WBC: 10.5 10*3/uL (ref 3.4–10.8)

## 2017-03-11 LAB — CMP14+EGFR
ALBUMIN: 4.7 g/dL (ref 3.5–5.5)
ALK PHOS: 95 IU/L (ref 39–117)
ALT: 31 IU/L (ref 0–32)
AST: 19 IU/L (ref 0–40)
Albumin/Globulin Ratio: 1.6 (ref 1.2–2.2)
BUN/Creatinine Ratio: 15 (ref 9–23)
BUN: 12 mg/dL (ref 6–24)
CO2: 21 mmol/L (ref 20–29)
CREATININE: 0.78 mg/dL (ref 0.57–1.00)
Calcium: 10.1 mg/dL (ref 8.7–10.2)
Chloride: 99 mmol/L (ref 96–106)
GFR calc Af Amer: 103 mL/min/{1.73_m2} (ref >59)
GFR, EST NON AFRICAN AMERICAN: 89 mL/min/{1.73_m2} (ref >59)
GLUCOSE: 238 mg/dL — AB (ref 65–99)
Globulin, Total: 2.9 g/dL (ref 1.5–4.5)
Potassium: 4.4 mmol/L (ref 3.5–5.2)
Sodium: 136 mmol/L (ref 134–144)
Total Protein: 7.6 g/dL (ref 6.0–8.5)

## 2017-03-11 LAB — MICROALBUMIN / CREATININE URINE RATIO
CREATININE, UR: 147.8 mg/dL
Microalb/Creat Ratio: 34.3 mg/g creat — ABNORMAL HIGH (ref 0.0–30.0)
Microalbumin, Urine: 50.7 ug/mL

## 2017-03-13 ENCOUNTER — Telehealth: Payer: Self-pay

## 2017-03-13 NOTE — Telephone Encounter (Signed)
-----   Message from Zelda W Fleming, NP sent at 03/13/2017 10:44 AM EST ----- Lipid panel is extremely elevated; particularly triglycerides which are greater than 700. Please ask patient if she was fasting for her last lab visit. She is at increased risk for heart attack or stroke. Is she taking her Lipitor? 

## 2017-03-13 NOTE — Telephone Encounter (Signed)
-----   Message from Claiborne RiggZelda W Fleming, NP sent at 03/13/2017 10:44 AM EST ----- Lipid panel is extremely elevated; particularly triglycerides which are greater than 700. Please ask patient if she was fasting for her last lab visit. She is at increased risk for heart attack or stroke. Is she taking her Lipitor?

## 2017-03-13 NOTE — Telephone Encounter (Signed)
Pt. Returned nurse call regarding results. Please f/u with pt.  °

## 2017-03-13 NOTE — Telephone Encounter (Signed)
Patient is informed on her lab result. Patient stated that she start taking her Lipitor on Friday after the Office Visit and still taking it now. Patient stated that she did not fast for her labs.   Patient verified DOB.

## 2017-03-20 NOTE — Telephone Encounter (Signed)
Noted  

## 2017-03-31 ENCOUNTER — Other Ambulatory Visit: Payer: Self-pay | Admitting: Obstetrics and Gynecology

## 2017-03-31 ENCOUNTER — Ambulatory Visit: Payer: Self-pay | Attending: Nurse Practitioner | Admitting: Nurse Practitioner

## 2017-03-31 ENCOUNTER — Other Ambulatory Visit (HOSPITAL_COMMUNITY): Payer: Self-pay | Admitting: *Deleted

## 2017-03-31 ENCOUNTER — Encounter: Payer: Self-pay | Admitting: Nurse Practitioner

## 2017-03-31 ENCOUNTER — Ambulatory Visit: Payer: Self-pay | Admitting: Nurse Practitioner

## 2017-03-31 VITALS — BP 120/65 | HR 53 | Temp 98.2°F | Ht 62.0 in | Wt 183.6 lb

## 2017-03-31 DIAGNOSIS — Z7982 Long term (current) use of aspirin: Secondary | ICD-10-CM | POA: Insufficient documentation

## 2017-03-31 DIAGNOSIS — F17210 Nicotine dependence, cigarettes, uncomplicated: Secondary | ICD-10-CM | POA: Insufficient documentation

## 2017-03-31 DIAGNOSIS — Z79899 Other long term (current) drug therapy: Secondary | ICD-10-CM | POA: Insufficient documentation

## 2017-03-31 DIAGNOSIS — I10 Essential (primary) hypertension: Secondary | ICD-10-CM | POA: Insufficient documentation

## 2017-03-31 DIAGNOSIS — Z1211 Encounter for screening for malignant neoplasm of colon: Secondary | ICD-10-CM

## 2017-03-31 DIAGNOSIS — Z7984 Long term (current) use of oral hypoglycemic drugs: Secondary | ICD-10-CM | POA: Insufficient documentation

## 2017-03-31 DIAGNOSIS — F329 Major depressive disorder, single episode, unspecified: Secondary | ICD-10-CM | POA: Insufficient documentation

## 2017-03-31 DIAGNOSIS — Z9049 Acquired absence of other specified parts of digestive tract: Secondary | ICD-10-CM | POA: Insufficient documentation

## 2017-03-31 DIAGNOSIS — E785 Hyperlipidemia, unspecified: Secondary | ICD-10-CM | POA: Insufficient documentation

## 2017-03-31 DIAGNOSIS — F324 Major depressive disorder, single episode, in partial remission: Secondary | ICD-10-CM

## 2017-03-31 DIAGNOSIS — E11649 Type 2 diabetes mellitus with hypoglycemia without coma: Secondary | ICD-10-CM | POA: Insufficient documentation

## 2017-03-31 DIAGNOSIS — E1165 Type 2 diabetes mellitus with hyperglycemia: Secondary | ICD-10-CM | POA: Insufficient documentation

## 2017-03-31 DIAGNOSIS — Z90710 Acquired absence of both cervix and uterus: Secondary | ICD-10-CM | POA: Insufficient documentation

## 2017-03-31 DIAGNOSIS — Z1231 Encounter for screening mammogram for malignant neoplasm of breast: Secondary | ICD-10-CM

## 2017-03-31 LAB — GLUCOSE, POCT (MANUAL RESULT ENTRY): POC Glucose: 171 mg/dl — AB (ref 70–99)

## 2017-03-31 MED ORDER — PAROXETINE HCL 20 MG PO TABS: 20.0000 mg | ORAL_TABLET | Freq: Every day | ORAL | 1 refills | Status: AC

## 2017-03-31 MED ORDER — "INSULIN SYRINGE-NEEDLE U-100 30G X 5/16"" 1 ML MISC": 12 refills | Status: AC

## 2017-03-31 MED ORDER — ALBUTEROL SULFATE HFA 108 (90 BASE) MCG/ACT IN AERS: 2.0000 | INHALATION_SPRAY | Freq: Four times a day (QID) | RESPIRATORY_TRACT | 2 refills | Status: AC | PRN

## 2017-03-31 MED ORDER — ATORVASTATIN CALCIUM 40 MG PO TABS: 40.0000 mg | ORAL_TABLET | Freq: Every day | ORAL | 2 refills | Status: DC

## 2017-03-31 MED FILL — TRUEPLUS SYR 1ML 30GX5/16: 30G X 5/16" | 30 days supply | Qty: 100 | Fill #0

## 2017-03-31 MED FILL — !VENTOLIN HFA INHALER: 108 (90 BAS | 25 days supply | Qty: 18 | Fill #0

## 2017-03-31 MED FILL — JANUVIA 100 MG TABLET: 100 | 30 days supply | Qty: 30 | Fill #1

## 2017-03-31 MED FILL — ?ATORVASTATIN 40MG TABLET: 40 | 30 days supply | Qty: 30 | Fill #0

## 2017-03-31 MED FILL — TRUEPLUS SYR 1ML 30GX5/16": 30G X 5/16" | 30 days supply | Qty: 100 | Fill #0

## 2017-03-31 NOTE — Progress Notes (Signed)
Assessment & Plan:  Sara Vasquez was seen today for follow-up.  Diagnoses and all orders for this visit:  Major depressive disorder with single episode, in partial remission (HCC) -     PARoxetine (PAXIL) 20 MG tablet; Take 1 tablet (20 mg total) by mouth daily.  Uncontrolled type 2 diabetes mellitus with hyperglycemia (HCC) -     Glucose (CBG) -     Insulin Syringe-Needle U-100 (TRUEPLUS INSULIN SYRINGE) 30G X 5/16" 1 ML MISC; Inject into the skin as instructed -     Ambulatory referral to Ophthalmology  Dyslipidemia, goal LDL below 100 -     atorvastatin (LIPITOR) 40 MG tablet; Take 1 tablet (40 mg total) by mouth daily. Instructions: Work on low fat, heart healthy diet and participate in regular aerobic exercise program to control as well.  Colon cancer screening -     Ambulatory referral to Gastroenterology  Tobacco dependence due to cigarettes -     albuterol (PROVENTIL HFA;VENTOLIN HFA) 108 (90 Base) MCG/ACT inhaler; Inhale 2 puffs into the lungs every 6 (six) hours as needed for wheezing or shortness of breath. Sara Vasquez was counseled on the dangers of tobacco use, and was advised to quit. Reviewed strategies to maximize success, including removing cigarettes and smoking materials from environment, stress management and support of family/friends as well as pharmacological alternatives including: Wellbutrin, Chantix, Nicotine patch, Nicotine gum or lozenges. Smoking cessation support: smoking cessation hotline: 1-800-QUIT-NOW.  Smoking cessation classes are also available through [facility]. Call [phone] or visit our website at [email or web address]   Spent 5 minutes counseling on smoking cessation and patient is not ready to quit.  Patient has been counseled on age-appropriate routine health concerns for screening and prevention. These are reviewed and up-to-date. Referrals have been placed accordingly. Immunizations are up-to-date or declined.    Subjective:    Chief Complaint  Patient presents with  . Follow-up    Patient is here for a follow-up.    HPI Sara Vasquez 51 y.o. female presents to office today for follow up to depression and insomnia.   Depression with Insomnia She was started on Paxil and Trazodone at her last office visit with me on 03-10-2017. Today she endorses improved mood.  Not taking trazodone and reports Paxil alone has improved her insomnia. Still concerned and stressed about her mother's health as she is currently being evaluated for possible parkinson's disease.   DMII She is compliant with her medications. Reports a few episodes of hypoglycemia down to 60s. She was instructed to decrease her glimiperide to '4mg'$  once a day with meals. She is waiting for her Celesta Gentile to be approved. She is still smoking.      Review of Systems  Constitutional: Negative for fever, malaise/fatigue and weight loss.  HENT: Negative.  Negative for nosebleeds.   Eyes: Negative.  Negative for blurred vision, double vision and photophobia.  Respiratory: Negative.  Negative for cough and shortness of breath.   Cardiovascular: Negative.  Negative for chest pain, palpitations and leg swelling.  Gastrointestinal: Negative.  Negative for abdominal pain, constipation, diarrhea, heartburn, nausea and vomiting.  Musculoskeletal: Negative.  Negative for myalgias.  Neurological: Negative.  Negative for dizziness, focal weakness, seizures and headaches.  Endo/Heme/Allergies: Negative for environmental allergies.  Psychiatric/Behavioral: Negative.  Negative for suicidal ideas.    Past Medical History:  Diagnosis Date  . Anxiety   . Arthritis   . Depression   . Diabetes mellitus without complication ([place])   . Hypertension  Past Surgical History:  Procedure Laterality Date  . ABDOMINAL HYSTERECTOMY    . BACK SURGERY    . CHOLECYSTECTOMY      Family History  Problem Relation Age of Onset  . Diabetes Mother   . Heart disease Mother     . Hypertension Father   . Heart disease Father   . Cancer Paternal Aunt        lung cancer  . Cancer Maternal Grandmother        breast cancer  . Diabetes Maternal Grandfather     Social History Reviewed with no changes to be made today.   Outpatient Medications Prior to Visit  Medication Sig Dispense Refill  . glimepiride (AMARYL) 4 MG tablet Take 2 tablets (8 mg total) daily before breakfast by mouth. 180 tablet 0  . insulin aspart protamine- aspart (NOVOLOG MIX 70/30) (70-30) 100 UNIT/ML injection Inject 0.5 mLs (50 Units total) 2 (two) times daily with a meal into the skin. 50 mL 3  . Multiple Vitamins-Minerals (MULTIVITAL) tablet Take 1 tablet by mouth daily.    Marland Kitchen nystatin cream (MYCOSTATIN) Apply 1 application 2 (two) times daily topically. 30 g 0  . ramipril (ALTACE) 2.5 MG capsule Take 1 capsule (2.5 mg total) daily by mouth. 90 capsule 3  . ranitidine (ZANTAC) 150 MG tablet TAKE 2 TABLET BY MOUTH AT BEDTIME. 60 tablet 2  . sitaGLIPtin (JANUVIA) 100 MG tablet Take 1 tablet (100 mg total) daily by mouth. 30 tablet 3  . PARoxetine (PAXIL) 20 MG tablet Take 1 tablet (20 mg total) daily by mouth. 30 tablet 1  . aspirin EC 81 MG tablet Take 1 tablet (81 mg total) by mouth daily. (Patient not taking: Reported on 03/10/2017) 30 tablet 11  . Blood Glucose Monitoring Suppl (TRUE METRIX METER) w/Device KIT USE AS DIRECTED (Patient not taking: Reported on 03/10/2017) 1 kit 0  . cholecalciferol (VITAMIN D) 1000 units tablet Take 1,000 Units by mouth daily.    Marland Kitchen glucose blood test strip USE AS DIRECTED 3 TIMES DAILY (Patient not taking: Reported on 03/10/2017) 100 each 11  . hydrOXYzine (ATARAX/VISTARIL) 10 MG tablet Take 1 tablet (10 mg total) by mouth 3 (three) times daily as needed. (Patient not taking: Reported on 03/10/2017) 30 tablet 5  . omega-3 acid ethyl esters (LOVAZA) 1 g capsule TAKE 1 CAPSULE BY MOUTH 2 TIMES DAILY (Patient not taking: Reported on 03/10/2017) 60 capsule 2  .  traZODone (DESYREL) 100 MG tablet Take 1 tablet (100 mg total) at bedtime as needed by mouth for sleep. (Patient not taking: Reported on 03/31/2017) 30 tablet 2  . albuterol (PROVENTIL HFA;VENTOLIN HFA) 108 (90 Base) MCG/ACT inhaler Inhale 2 puffs into the lungs every 6 (six) hours as needed for wheezing or shortness of breath. (Patient not taking: Reported on 03/10/2017) 1 Inhaler 2  . atorvastatin (LIPITOR) 40 MG tablet TAKE 1 TABLET (40 MG TOTAL) BY MOUTH DAILY. (Patient not taking: Reported on 03/10/2017) 30 tablet 2   No facility-administered medications prior to visit.     Allergies  Allergen Reactions  . Fentanyl   . Metformin And Related     Mood swings  . Phenergan [Promethazine Hcl]        Objective:    BP 120/65 (BP Location: Right Arm, Patient Position: Sitting, Cuff Size: Normal)   Pulse (!) 53   Temp 98.2 F (36.8 C) (Oral)   Ht '5\' 2"'$  (1.575 m)   Wt 183 lb 9.6 oz (83.3 kg)  SpO2 98%   BMI 33.58 kg/m  Wt Readings from Last 3 Encounters:  03/31/17 183 lb 9.6 oz (83.3 kg)  03/10/17 184 lb 3.2 oz (83.6 kg)  07/12/16 190 lb 6.4 oz (86.4 kg)    Physical Exam  Constitutional: She is oriented to person, place, and time. She appears well-developed and well-nourished. She is cooperative.  HENT:  Head: Normocephalic and atraumatic.  Eyes: EOM are normal.  Neck: Normal range of motion.  Cardiovascular: Normal rate, regular rhythm, normal heart sounds and intact distal pulses. Exam reveals no gallop and no friction rub.  No murmur heard. Pulmonary/Chest: Effort normal and breath sounds normal. No tachypnea. No respiratory distress. She has no decreased breath sounds. She has no wheezes. She has no rhonchi. She has no rales. She exhibits no tenderness.  Abdominal: Soft. Bowel sounds are normal.  Musculoskeletal: Normal range of motion. She exhibits no edema.  Neurological: She is alert and oriented to person, place, and time. Coordination normal.  Skin: Skin is warm and  dry.  Psychiatric: She has a normal mood and affect. Her behavior is normal. Judgment and thought content normal.  Nursing note and vitals reviewed.     Patient has been counseled extensively about nutrition and exercise as well as the importance of adherence with medications and regular follow-up. The patient was given clear instructions to go to ER or return to medical center if symptoms don't improve, worsen or new problems develop. The patient verbalized understanding.   Follow-up: Return in about 2 months (around 06/12/2017) for HTN/HPL/DM.   Gildardo Pounds, FNP-BC [facility] and [place], [place]   03/31/2017, 1:20 PM

## 2017-04-07 MED FILL — PARoxetine HCL 20 MG TABS: 20 | 30 days supply | Qty: 30 | Fill #1

## 2017-04-07 MED FILL — BENZONATATE 100 MG CAPSULE: 100 | 15 days supply | Qty: 30 | Fill #2

## 2017-04-07 MED FILL — raNITIdine HCL 150 MG TABS: 150 | 30 days supply | Qty: 60 | Fill #1

## 2017-04-13 ENCOUNTER — Ambulatory Visit
Admission: RE | Admit: 2017-04-13 | Discharge: 2017-04-13 | Disposition: A | Discharge status: Home / Self Care | Payer: No Typology Code available for payment source | Source: Ambulatory Visit | Attending: Obstetrics and Gynecology | Admitting: Obstetrics and Gynecology

## 2017-04-13 ENCOUNTER — Encounter (HOSPITAL_COMMUNITY): Payer: Self-pay

## 2017-04-13 ENCOUNTER — Ambulatory Visit (HOSPITAL_COMMUNITY)
Admission: RE | Admit: 2017-04-13 | Discharge: 2017-04-13 | Disposition: A | Discharge status: Home / Self Care | Payer: Self-pay | Source: Ambulatory Visit | Attending: Obstetrics and Gynecology | Admitting: Obstetrics and Gynecology

## 2017-04-13 VITALS — BP 114/70 | Temp 98.3°F | Ht 62.5 in | Wt 187.8 lb

## 2017-04-13 DIAGNOSIS — Z1239 Encounter for other screening for malignant neoplasm of breast: Secondary | ICD-10-CM

## 2017-04-13 DIAGNOSIS — Z1231 Encounter for screening mammogram for malignant neoplasm of breast: Secondary | ICD-10-CM

## 2017-04-13 NOTE — Patient Instructions (Signed)
Explained breast self awareness with Julious PayerWendy W Blumstein. Patient did not need a Pap smear today due to patient has a history of a hysterectomy for benign reasons. Let patient know that she doesn't need any further Pap smears due to her history of a hysterectomy for benign reasons. [facility]. Appointment scheduled for Thursday, April 13, 2017 at 1440. Let patient know the Breast Center will follow up with her within the next couple weeks with results of mammogram by letter or phone. Julious PayerWendy W Kester verbalized understanding.  Ambrosio Reuter, Kathaleen Maserhristine Poll, RN 12:28 PM

## 2017-04-13 NOTE — Progress Notes (Signed)
No complaints today.   Pap Smear: Pap smear not completed today. Last Pap smear was in 2004 or 2005 and normal per patient. Per patient has no history of an abnormal Pap smear. Patient has a history of a complete hysterectomy in 2004 or 2005 after her last Pap smear due to AUB and endometriosis. Patient no longer needs Pap smears due to her history of a hysterectomy for benign reasons per BCCCP and ACOG guidelines. No Pap smear results are in Epic.  Physical exam: Breasts Breasts symmetrical. No skin abnormalities bilateral breasts. No nipple retraction bilateral breasts. No nipple discharge bilateral breasts. No lymphadenopathy. No lumps palpated bilateral breasts. No complaints of pain or tenderness on exam. [facility]. Appointment scheduled for Thursday, April 13, 2017 at 1440.        Pelvic/Bimanual No Pap smear completed today since patient has a history of a hysterectomy for benign reasons. Pap smear not indicated per BCCCP guidelines.   Smoking History: Patient is a current smoker. Discussed smoking cessation with patient. [facility] and gave resources to free smoking cessation classes at [facility].  Patient Navigation: Patient education provided. Access to services provided for patient through BCCCP program.   Colorectal Cancer Screening: Per patient has never had a colonoscopy completed. No complaints today. FIT Test given to patient to complete and return to BCCCP.

## 2017-04-14 ENCOUNTER — Encounter (HOSPITAL_COMMUNITY): Payer: Self-pay | Admitting: *Deleted

## 2017-05-08 ENCOUNTER — Encounter: Payer: Self-pay | Admitting: Nurse Practitioner

## 2017-05-23 ENCOUNTER — Other Ambulatory Visit: Payer: Self-pay

## 2017-05-23 ENCOUNTER — Encounter (HOSPITAL_COMMUNITY): Payer: Self-pay | Admitting: Emergency Medicine

## 2017-05-23 ENCOUNTER — Emergency Department (HOSPITAL_COMMUNITY): Payer: No Typology Code available for payment source

## 2017-05-23 ENCOUNTER — Emergency Department (HOSPITAL_COMMUNITY)
Admission: EM | Admit: 2017-05-23 | Discharge: 2017-05-23 | Disposition: A | Discharge status: Home / Self Care | Payer: No Typology Code available for payment source | Attending: Emergency Medicine | Admitting: Emergency Medicine

## 2017-05-23 DIAGNOSIS — M940 Chondrocostal junction syndrome [Tietze]: Secondary | ICD-10-CM | POA: Insufficient documentation

## 2017-05-23 DIAGNOSIS — Z794 Long term (current) use of insulin: Secondary | ICD-10-CM | POA: Insufficient documentation

## 2017-05-23 DIAGNOSIS — E119 Type 2 diabetes mellitus without complications: Secondary | ICD-10-CM | POA: Insufficient documentation

## 2017-05-23 DIAGNOSIS — Z7982 Long term (current) use of aspirin: Secondary | ICD-10-CM | POA: Insufficient documentation

## 2017-05-23 DIAGNOSIS — Z79899 Other long term (current) drug therapy: Secondary | ICD-10-CM | POA: Insufficient documentation

## 2017-05-23 DIAGNOSIS — R0789 Other chest pain: Secondary | ICD-10-CM

## 2017-05-23 DIAGNOSIS — I10 Essential (primary) hypertension: Secondary | ICD-10-CM | POA: Insufficient documentation

## 2017-05-23 DIAGNOSIS — F17210 Nicotine dependence, cigarettes, uncomplicated: Secondary | ICD-10-CM | POA: Insufficient documentation

## 2017-05-23 LAB — CBC
HEMATOCRIT: 44 % (ref 36.0–46.0)
HEMOGLOBIN: 15.2 g/dL — AB (ref 12.0–15.0)
MCH: 30.2 pg (ref 26.0–34.0)
MCHC: 34.5 g/dL (ref 30.0–36.0)
MCV: 87.5 fL (ref 78.0–100.0)
Platelets: 318 10*3/uL (ref 150–400)
RBC: 5.03 MIL/uL (ref 3.87–5.11)
RDW: 13.7 % (ref 11.5–15.5)
WBC: 12 10*3/uL — ABNORMAL HIGH (ref 4.0–10.5)

## 2017-05-23 LAB — BASIC METABOLIC PANEL
ANION GAP: 11 (ref 5–15)
BUN: 10 mg/dL (ref 6–20)
CALCIUM: 9.7 mg/dL (ref 8.9–10.3)
CO2: 22 mmol/L (ref 22–32)
Chloride: 105 mmol/L (ref 101–111)
Creatinine, Ser: 0.82 mg/dL (ref 0.44–1.00)
GFR calc non Af Amer: >60 mL/min (ref >60)
Glucose, Bld: 189 mg/dL — ABNORMAL HIGH (ref 65–99)
POTASSIUM: 4.1 mmol/L (ref 3.5–5.1)
Sodium: 138 mmol/L (ref 135–145)

## 2017-05-23 LAB — I-STAT TROPONIN, ED: TROPONIN I, POC: 0 ng/mL (ref 0.00–0.08)

## 2017-05-23 MED ORDER — IBUPROFEN 400 MG PO TABS
400.0000 mg | ORAL_TABLET | Freq: Once | ORAL | Status: AC | PRN
  Administered 2017-05-23: 400 mg via ORAL
  Filled 2017-05-23: qty 1

## 2017-05-23 MED ORDER — IBUPROFEN 600 MG PO TABS: 600.0000 mg | ORAL_TABLET | Freq: Four times a day (QID) | ORAL | 0 refills | Status: AC | PRN

## 2017-05-23 MED ORDER — BENZONATATE 100 MG PO CAPS
100.0000 mg | ORAL_CAPSULE | Freq: Three times a day (TID) | ORAL | 0 refills | Status: DC | PRN
Start: 2017-05-23 — End: 2017-06-02

## 2017-05-23 MED ORDER — BENZONATATE 100 MG PO CAPS
200.0000 mg | ORAL_CAPSULE | Freq: Once | ORAL | Status: AC
Start: 2017-05-23 — End: 2017-05-23
  Administered 2017-05-23: 200 mg via ORAL
  Filled 2017-05-23: qty 2

## 2017-05-23 MED ORDER — IBUPROFEN 400 MG PO TABS: 400.0000 mg | ORAL_TABLET | Freq: Once | ORAL | Status: DC | PRN

## 2017-05-23 MED FILL — PARoxetine HCL 20 MG TABS: 20 | 30 days supply | Qty: 30 | Fill #0

## 2017-05-23 MED FILL — ?ATORVASTATIN 40MG TABLET: 40 | 30 days supply | Qty: 30 | Fill #1

## 2017-05-23 MED FILL — TRUE METRIX TEST STRIP: 30 days supply | Qty: 100 | Fill #1

## 2017-05-23 MED FILL — RAMIPRIL 2.5 MG CAPS: 2.5 | 30 days supply | Qty: 30 | Fill #1

## 2017-05-23 MED FILL — ?GLIMEPIRIDE 4 MG TABLET: 4 | 30 days supply | Qty: 60 | Fill #1

## 2017-05-23 MED FILL — !NOVOLOG MIX 70/30 VIAL: 70-30/ML | 10 days supply | Qty: 10 | Fill #1

## 2017-05-23 MED FILL — raNITIdine HCL 150 MG TABS: 150 | 30 days supply | Qty: 60 | Fill #2

## 2017-05-23 NOTE — ED Provider Notes (Signed)
Patient placed in Quick Look pathway, seen and evaluated   Chief Complaint: Rib pain  HPI:   52 year old female presents today with complaints of bilateral rib pain.  Patient notes this morning she started to have a burning sharp pain in her right lateral ribs and anterior chest.  She notes she also has somewhat similar sensation on the left as well.  She reports the pain is worse with even light palpation.  She denies any significant anterior chest pain, denies any shortness of breath cough or fever.  Patient notes she did have chickenpox as a kid, no shingles vaccination.  ROS: Rib pain (one)  Physical Exam:   Gen: No distress  Neuro: Awake and Alert  Skin: Warm    Focused Exam: Chest without rash or vesicles, no bruising, lungs sound clear   Initiation of care has begun. The patient has been counseled on the process, plan, and necessity for staying for the completion/evaluation, and the remainder of the medical screening examination    Eyvonne MechanicHedges, Kyrsten Deleeuw, Cordelia Poche-C 05/23/17 1656    Little, Ambrose Finlandachel Morgan, MD 05/26/17 [id number]

## 2017-05-23 NOTE — ED Notes (Addendum)
Patient return from xray, requesting Tylenol for pain 10/10

## 2017-05-23 NOTE — ED Provider Notes (Signed)
Sara Vasquez   CSN: [id number] Arrival date & time: 05/23/17  1637     History   Chief Complaint Chief Complaint  Patient presents with  . Chest Pain    HPI Sara Vasquez is a 52 y.o. female.  52 year old female with a history of anxiety, depression, hypertension, diabetes presents to the emergency department for evaluation of chest pain.  She states that she has had a burning, sharp pain which began this morning under her right breast.  It is specifically worse with palpation.  She next noticed the pain in her central chest as well as, later, under her left breast.  Pain has been fairly constant.  No medications taken prior to arrival for symptoms.  She has been coughing over the past few days.  She attributed her cough to a "smoker's cough".  Patient was taking Best boy for this, but ran out of this medication a few days ago.  She denies any shortness of breath, fever, vomiting, diarrhea.  She did have chickenpox as a child and notes shingles vaccination.  Her mother recently had shingles.  She has not noticed any associated rash.      Past Medical History:  Diagnosis Date  . Anxiety   . Arthritis   . Depression   . Diabetes mellitus without complication ([place])   . Hypertension     Patient Active Problem List   Diagnosis Date Noted  . Major depressive disorder with single episode, in partial remission ([place]) 03/31/2017  . Anxiety and depression 03/10/2017  . Uncontrolled type 2 diabetes mellitus with hyperglycemia ([place]) 03/10/2017  . Thumb pain, right 07/12/2016  . Hot flashes due to menopause 05/05/2016  . Pain, dental 05/05/2016  . Left wrist tendinitis 03/29/2016  . Homelessness 11/23/2015  . Toe injury 11/23/2015  . Other fatigue 11/23/2015  . Esophageal reflux 10/01/2015  . Bilateral hand swelling 10/01/2015  . Chronic low back pain 07/09/2015  . Elevated triglycerides with high cholesterol 06/19/2015  .  Traumatic myalgia 04/02/2015  . Acute posttraumatic stress disorder 04/02/2015  . Tendinopathy of right rotator cuff 02/04/2015  . Vitamin D deficiency 03/19/2014  . Abnormal LFTs 03/19/2014  . Hyperlipidemia associated with type 2 diabetes mellitus ([place]) 03/19/2014  . Essential hypertension, benign 12/19/2013  . Tobacco dependence due to cigarettes 12/19/2013  . Depression 12/19/2013    Past Surgical History:  Procedure Laterality Date  . ABDOMINAL HYSTERECTOMY    . BACK SURGERY    . CHOLECYSTECTOMY      OB History    Gravida Para Term Preterm AB Living   2         2   SAB TAB Ectopic Multiple Live Births           2       Home Medications    Prior to Admission medications   Medication Sig Start Date End Date Taking? Authorizing Provider  albuterol (PROVENTIL HFA;VENTOLIN HFA) 108 (90 Base) MCG/ACT inhaler Inhale 2 puffs into the lungs every 6 (six) hours as needed for wheezing or shortness of breath. 03/31/17   Gildardo Pounds, NP  aspirin EC 81 MG tablet Take 1 tablet (81 mg total) by mouth daily. 07/12/16   Funches, Adriana Mccallum, MD  atorvastatin (LIPITOR) 40 MG tablet Take 1 tablet (40 mg total) by mouth daily. 03/31/17   Gildardo Pounds, NP  benzonatate (TESSALON) 100 MG capsule Take 1-2 capsules (100-200 mg total) by mouth 3 (three) times daily  as needed for cough. 05/23/17   Antonietta Breach, PA-C  Blood Glucose Monitoring Suppl (TRUE METRIX METER) w/Device KIT USE AS DIRECTED 12/02/15   Boykin Nearing, MD  cholecalciferol (VITAMIN D) 1000 units tablet Take 1,000 Units by mouth daily.    [provider]  glimepiride (AMARYL) 4 MG tablet Take 2 tablets (8 mg total) daily before breakfast by mouth. 03/10/17 06/08/17  Gildardo Pounds, NP  glucose blood test strip USE AS DIRECTED 3 TIMES DAILY 07/27/16   Boykin Nearing, MD  hydrOXYzine (ATARAX/VISTARIL) 10 MG tablet Take 1 tablet (10 mg total) by mouth 3 (three) times daily as needed. Patient not taking: Reported on  03/10/2017 11/23/15   Boykin Nearing, MD  ibuprofen (ADVIL,MOTRIN) 600 MG tablet Take 1 tablet (600 mg total) by mouth every 6 (six) hours as needed. 05/23/17   Antonietta Breach, PA-C  insulin aspart protamine- aspart (NOVOLOG MIX 70/30) (70-30) 100 UNIT/ML injection Inject 0.5 mLs (50 Units total) 2 (two) times daily with a meal into the skin. 03/10/17   Gildardo Pounds, NP  Insulin Syringe-Needle U-100 (TRUEPLUS INSULIN SYRINGE) 30G X 5/16" 1 ML MISC Inject into the skin as instructed 03/31/17   Gildardo Pounds, NP  Multiple Vitamins-Minerals (MULTIVITAL) tablet Take 1 tablet by mouth daily.    [provider]  nystatin cream (MYCOSTATIN) Apply 1 application 2 (two) times daily topically. 03/10/17   Gildardo Pounds, NP  omega-3 acid ethyl esters (LOVAZA) 1 g capsule TAKE 1 CAPSULE BY MOUTH 2 TIMES DAILY 03/11/16   Funches, Adriana Mccallum, MD  PARoxetine (PAXIL) 20 MG tablet Take 1 tablet (20 mg total) by mouth daily. 03/31/17 06/29/17  Gildardo Pounds, NP  ramipril (ALTACE) 2.5 MG capsule Take 1 capsule (2.5 mg total) daily by mouth. 03/10/17   Gildardo Pounds, NP  ranitidine (ZANTAC) 150 MG tablet TAKE 2 TABLET BY MOUTH AT BEDTIME. 03/10/17   Gildardo Pounds, NP  sitaGLIPtin (JANUVIA) 100 MG tablet Take 1 tablet (100 mg total) daily by mouth. 03/10/17   Gildardo Pounds, NP  traZODone (DESYREL) 100 MG tablet Take 1 tablet (100 mg total) at bedtime as needed by mouth for sleep. 03/10/17   Gildardo Pounds, NP  fluticasone (FLONASE) 50 MCG/ACT nasal spray Place 2 sprays into both nostrils daily. Patient not taking: Reported on 08/15/2014 03/19/14 01/20/15  Lorayne Marek, MD    Family History Family History  Problem Relation Age of Onset  . Diabetes Mother   . Heart disease Mother   . Hypertension Father   . Heart disease Father   . Cancer Paternal Aunt        lung cancer  . Cancer Maternal Grandmother        breast cancer  . Breast cancer Maternal Grandmother        ? date of onset  .  Diabetes Maternal Grandfather     Social History Social History   Tobacco Use  . Smoking status: Current Every Day Smoker    Packs/day: 1.00    Years: 35.00    Pack years: 35.00    Types: Cigarettes    Start date: 03/27/2014  . Smokeless tobacco: Never Used  Substance Use Topics  . Alcohol use: No    Alcohol/week: 0.0 oz  . Drug use: No     Allergies   Fentanyl; Metformin and related; and Phenergan [promethazine hcl]   Review of Systems Review of Systems Ten systems reviewed and are negative for acute change, except as  noted in the HPI.    Physical Exam Updated Vital Signs BP 130/64 (BP Location: Right Arm)   Pulse 60   Temp 98.2 F (36.8 C) (Oral)   Resp 17   Ht 5' 2.5" (1.588 m)   Wt 85.7 kg (189 lb)   SpO2 100%   BMI 34.02 kg/m   Physical Exam  Constitutional: She is oriented to person, place, and time. She appears well-developed and well-nourished. No distress.  Nontoxic appearing and in NAD  HENT:  Head: Normocephalic and atraumatic.  Eyes: Conjunctivae and EOM are normal. No scleral icterus.  Neck: Normal range of motion.  Cardiovascular: Normal rate, regular rhythm and intact distal pulses.  Pulmonary/Chest: Effort normal. No stridor. No respiratory distress. She has no wheezes.  TTP under the left and right breast as well as to the upper central chest. No bony deformity or crepitus. Chest expansion symmetric. Respirations even and unlabored.  Musculoskeletal: Normal range of motion.  Neurological: She is alert and oriented to person, place, and time. She exhibits normal muscle tone. Coordination normal.  GCS 15. Patient moving all extremities.  Skin: Skin is warm and dry. No rash noted. She is not diaphoretic. No erythema. No pallor.  Psychiatric: She has a normal mood and affect. Her behavior is normal.  Nursing Vasquez and vitals reviewed.    ED Treatments / Results  Labs (all labs ordered are listed, but only abnormal results are displayed) Labs  Reviewed  BASIC METABOLIC PANEL - Abnormal; Notable for the following components:      Result Value   Glucose, Bld 189 (*)    All other components within normal limits  CBC - Abnormal; Notable for the following components:   WBC 12.0 (*)    Hemoglobin 15.2 (*)    All other components within normal limits  I-STAT TROPONIN, ED    EKG  EKG Interpretation None       Radiology Dg Chest 2 View  Result Date: 05/23/2017 CLINICAL DATA:  Chest pain EXAM: CHEST  2 VIEW COMPARISON:  None. FINDINGS: Heart and mediastinal contours are within normal limits. No focal opacities or effusions. No acute bony abnormality. IMPRESSION: No active cardiopulmonary disease. Electronically Signed   By: Rolm Baptise M.D.   On: 05/23/2017 17:56    Procedures Procedures (including critical care time)  Medications Ordered in ED Medications  ibuprofen (ADVIL,MOTRIN) tablet 400 mg (not administered)  benzonatate (TESSALON) capsule 200 mg (not administered)  ibuprofen (ADVIL,MOTRIN) tablet 400 mg (400 mg Oral Given 05/23/17 1800)     Initial Impression / Assessment and Plan / ED Course  I have reviewed the triage vital signs and the nursing notes.  Pertinent labs & imaging results that were available during my care of the patient were reviewed by me and considered in my medical decision making (see chart for details).     52 year old female presents for chest pain which is reproducible on palpation.  This lends itself to musculoskeletal etiology.  Sharp and burning nature of the pain was initially possibly thought to be due to a process such as shingles; however, tenderness is throughout the anterior and lateral chest and does not follow a dermatomal pattern.  She has no bony deformity or crepitus.  X-ray without evidence of acute cardiopulmonary abnormality.  A cardiac workup was completed in triage to rule out atypical ACS.  She has a reassuring, nonischemic EKG as well as a negative troponin.  Suspect  that symptoms may be due to mild costochondritis as  patient reports increased coughing over the past few days.  She has been given a prescription for Tessalon.  NSAIDs advised in the interim for pain.  She has had some improvement in her discomfort after receiving ibuprofen in triage.  Return precautions discussed and provided. Patient discharged in stable condition with no unaddressed concerns.   Final Clinical Impressions(s) / ED Diagnoses   Final diagnoses:  Chest wall pain  Costochondritis    ED Discharge Orders        Ordered    ibuprofen (ADVIL,MOTRIN) 600 MG tablet  Every 6 hours PRN     05/23/17 2044    benzonatate (TESSALON) 100 MG capsule  3 times daily PRN     05/23/17 2044       Antonietta Breach, PA-C 05/23/17 2302    Duffy Bruce, MD 05/24/17 [phone]

## 2017-05-23 NOTE — ED Notes (Signed)
Patient given discharge instructions and verbalized understanding.  Patient stable to discharge at this time.  Patient is alert and oriented to baseline.  No distressed noted at this time.  All belongings taken with the patient at discharge.   

## 2017-05-23 NOTE — ED Triage Notes (Signed)
Pt states that she woke up with a generalized chest pain that feels like a burning sensation, pain worsens with palpation.

## 2017-05-26 ENCOUNTER — Other Ambulatory Visit: Payer: Self-pay | Admitting: Nurse Practitioner

## 2017-05-26 DIAGNOSIS — E781 Pure hyperglyceridemia: Secondary | ICD-10-CM

## 2017-05-26 MED ORDER — OMEGA-3-ACID ETHYL ESTERS 1 G PO CAPS: 2.0000 g | ORAL_CAPSULE | Freq: Two times a day (BID) | ORAL | 2 refills | Status: DC

## 2017-05-29 MED FILL — OMEGA-3 ETHYL ESTERS 1 GM C: 1 | 15 days supply | Qty: 60 | Fill #0

## 2017-06-02 ENCOUNTER — Encounter: Payer: Self-pay | Admitting: Nurse Practitioner

## 2017-06-02 ENCOUNTER — Ambulatory Visit: Payer: Self-pay | Attending: Nurse Practitioner | Admitting: Nurse Practitioner

## 2017-06-02 VITALS — BP 131/74 | HR 63 | Temp 98.1°F | Ht 62.0 in | Wt 189.2 lb

## 2017-06-02 DIAGNOSIS — F17200 Nicotine dependence, unspecified, uncomplicated: Secondary | ICD-10-CM

## 2017-06-02 DIAGNOSIS — G2581 Restless legs syndrome: Secondary | ICD-10-CM | POA: Insufficient documentation

## 2017-06-02 DIAGNOSIS — E1165 Type 2 diabetes mellitus with hyperglycemia: Secondary | ICD-10-CM | POA: Insufficient documentation

## 2017-06-02 DIAGNOSIS — R059 Cough, unspecified: Secondary | ICD-10-CM

## 2017-06-02 DIAGNOSIS — Z716 Tobacco abuse counseling: Secondary | ICD-10-CM | POA: Insufficient documentation

## 2017-06-02 DIAGNOSIS — R05 Cough: Secondary | ICD-10-CM | POA: Insufficient documentation

## 2017-06-02 DIAGNOSIS — I10 Essential (primary) hypertension: Secondary | ICD-10-CM | POA: Insufficient documentation

## 2017-06-02 DIAGNOSIS — R7989 Other specified abnormal findings of blood chemistry: Secondary | ICD-10-CM

## 2017-06-02 DIAGNOSIS — Z9049 Acquired absence of other specified parts of digestive tract: Secondary | ICD-10-CM | POA: Insufficient documentation

## 2017-06-02 DIAGNOSIS — K029 Dental caries, unspecified: Secondary | ICD-10-CM | POA: Insufficient documentation

## 2017-06-02 DIAGNOSIS — E11649 Type 2 diabetes mellitus with hypoglycemia without coma: Secondary | ICD-10-CM | POA: Insufficient documentation

## 2017-06-02 DIAGNOSIS — Z885 Allergy status to narcotic agent status: Secondary | ICD-10-CM | POA: Insufficient documentation

## 2017-06-02 DIAGNOSIS — Z794 Long term (current) use of insulin: Secondary | ICD-10-CM | POA: Insufficient documentation

## 2017-06-02 DIAGNOSIS — Z79899 Other long term (current) drug therapy: Secondary | ICD-10-CM | POA: Insufficient documentation

## 2017-06-02 DIAGNOSIS — Z888 Allergy status to other drugs, medicaments and biological substances status: Secondary | ICD-10-CM | POA: Insufficient documentation

## 2017-06-02 DIAGNOSIS — F419 Anxiety disorder, unspecified: Secondary | ICD-10-CM | POA: Insufficient documentation

## 2017-06-02 DIAGNOSIS — Z7982 Long term (current) use of aspirin: Secondary | ICD-10-CM | POA: Insufficient documentation

## 2017-06-02 DIAGNOSIS — F17210 Nicotine dependence, cigarettes, uncomplicated: Secondary | ICD-10-CM | POA: Insufficient documentation

## 2017-06-02 DIAGNOSIS — F329 Major depressive disorder, single episode, unspecified: Secondary | ICD-10-CM | POA: Insufficient documentation

## 2017-06-02 DIAGNOSIS — E781 Pure hyperglyceridemia: Secondary | ICD-10-CM

## 2017-06-02 DIAGNOSIS — F324 Major depressive disorder, single episode, in partial remission: Secondary | ICD-10-CM

## 2017-06-02 LAB — POCT GLYCOSYLATED HEMOGLOBIN (HGB A1C): Hemoglobin A1C: 9

## 2017-06-02 LAB — GLUCOSE, POCT (MANUAL RESULT ENTRY): POC Glucose: 195 mg/dl — AB (ref 70–99)

## 2017-06-02 MED ORDER — BENZONATATE 100 MG PO CAPS: 100.0000 mg | ORAL_CAPSULE | Freq: Three times a day (TID) | ORAL | 0 refills | Status: AC | PRN

## 2017-06-02 MED ORDER — ROPINIROLE HCL 2 MG PO TABS: 2.0000 mg | ORAL_TABLET | Freq: Every day | ORAL | 1 refills | Status: DC

## 2017-06-02 MED FILL — BENZONATATE 100 MG CAPSULE: 100 | 3 days supply | Qty: 21 | Fill #0

## 2017-06-02 MED FILL — ?ROPINIROLE HCL 2MG TABLET: 2 | 30 days supply | Qty: 30 | Fill #0

## 2017-06-02 NOTE — Progress Notes (Signed)
Assessment & Plan:  Sara Vasquez was seen today for follow-up and dental pain.  Diagnoses and all orders for this visit:  Uncontrolled type 2 diabetes mellitus with hyperglycemia (HCC) -     Glucose (CBG) -     HgB A1c -     Lipid panel  Diabetes is poorly controlled. Advised patient to keep a fasting blood sugar log fast, 2 hours post lunch and bedtime which will be reviewed at the next office visit.  Major depressive disorder with single episode, in partial remission ([place]) Continue Paxil and trazodone as prescribed.   Tobacco dependence Sara Vasquez was counseled on the dangers of tobacco use, and was advised to quit. Reviewed strategies to maximize success, including removing cigarettes and smoking materials from environment, stress management and support of family/friends as well as pharmacological alternatives including: Wellbutrin, Chantix, Nicotine patch, Nicotine gum or lozenges. Smoking cessation support: smoking cessation hotline: 1-800-QUIT-NOW.  Smoking cessation classes are also available through [facility]. Call [phone] or visit our website at [email or web address]   Spent 3 minutes counseling on smoking cessation and patient is not ready to quit.  Restless leg syndrome -     rOPINIRole (REQUIP) 2 MG tablet; Take 1 tablet (2 mg total) by mouth at bedtime.  Abnormal CBC -     CBC  Cough in adult patient -     benzonatate (TESSALON) 100 MG capsule; Take 1-2 capsules (100-200 mg total) by mouth 3 (three) times daily as needed for cough. STOP SMOKING    Patient has been counseled on age-appropriate routine health concerns for screening and prevention. These are reviewed and up-to-date. Referrals have been placed accordingly. Immunizations are up-to-date or declined.    Subjective:   Chief Complaint  Patient presents with  . Follow-up    Patient is here for a follow-up and lab work done.   . Dental Pain    Patient stated she have tooth pain.     HPI Sara Vasquez 52 y.o. female presents to office today for follow up.   Tobacco Dependence Has cut back from smoking a ppd to half ppd. She continues to decline smoking cessation aid.   Dental Pain Several broken off teeth and dental caries. She is using canker pain relief gel and ibuprofen for pain relief. She denies any bleeding gums, fever or abscesses.   Diabetes Mellitus Checking her blood sugars twice a day. Blood sugar 203 at home today and 195 in office. She endorses hypoglycemia when her blood sugars reach the 90s. I instructed her that she feels hypoglycemic in the 90s because her body is used to higher levels of glucose. She is also not taking her 70/30 on a routine schedule. 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 I instructed her to try to keep a schedule with giving herself insulin in the morning with breakfast and then with dinner.  Lab Results  Component Value Date   HGBA1C 9.0 06/02/2017    Restless Leg Syndrome Tremor primarily involves the bilateral LE.  Onset of symptoms was gradual, starting about a few months ago. Symptoms are currently of moderate severity. Tremor exacerbated by lying down in her bed at night. Tremor is alleviated by massage. Symptoms occur at bedtime and during sleep and last during the night. She also describes symptoms  of sleep disturbance. She denies bilateral hand tremor, drooling during sleep (wet pillows), rigidity, postural changes, difficulty in initiating movement and difficulty with walking.     Review of Systems  Constitutional: Negative for fever, malaise/fatigue and weight loss.  HENT: Negative for congestion, ear pain, hearing loss, sinus pain and sore throat.        SEE HPI  Eyes: Negative.  Negative for blurred vision, double vision and  photophobia.  Respiratory: Positive for cough. Negative for sputum production, shortness of breath and wheezing.   Cardiovascular: Negative.  Negative for chest pain, palpitations and leg swelling.  Gastrointestinal: Negative.  Negative for abdominal pain, constipation, diarrhea, heartburn, nausea and vomiting.  Musculoskeletal: Negative.  Negative for myalgias.  Neurological: Positive for tremors. Negative for dizziness, focal weakness, seizures and headaches.  Endo/Heme/Allergies: Negative for environmental allergies.  Psychiatric/Behavioral: Positive for depression. Negative for hallucinations, memory loss, substance abuse and suicidal ideas. The patient is nervous/anxious and has insomnia.     Past Medical History:  Diagnosis Date  . Anxiety   . Arthritis   . Depression   . Diabetes mellitus without complication ([place])   . Hypertension     Past Surgical History:  Procedure Laterality Date  . ABDOMINAL HYSTERECTOMY    . BACK SURGERY    . CHOLECYSTECTOMY      Family History  Problem Relation Age of Onset  . Diabetes Mother   . Heart disease Mother   . Hypertension Father   . Heart disease Father   . Cancer Paternal Aunt        lung cancer  . Cancer Maternal Grandmother        breast cancer  . Breast cancer Maternal Grandmother        ? date of onset  . Diabetes Maternal Grandfather     Social History Reviewed with no changes to be made today.   Outpatient Medications Prior to Visit  Medication Sig Dispense Refill  . albuterol (PROVENTIL HFA;VENTOLIN HFA) 108 (90 Base) MCG/ACT inhaler Inhale 2 puffs into the lungs every 6 (six) hours as needed for wheezing or shortness of breath. 1 Inhaler 2  . aspirin EC 81 MG tablet Take 1 tablet (81 mg total) by mouth daily. 30 tablet 11  . atorvastatin (LIPITOR) 40 MG tablet Take 1 tablet (40 mg total) by mouth daily. 30 tablet 2  . Blood Glucose Monitoring Suppl (TRUE METRIX METER) w/Device KIT USE AS DIRECTED 1 kit 0  .  glimepiride (AMARYL) 4 MG tablet Take 2 tablets (8 mg total) daily before breakfast by mouth. 180 tablet 0  . glucose blood test strip USE AS DIRECTED 3 TIMES DAILY 100 each 11  . insulin aspart protamine- aspart (NOVOLOG MIX 70/30) (70-30) 100 UNIT/ML injection Inject 0.5 mLs (50 Units total) 2 (two) times daily with a meal into the skin. 50 mL 3  . Insulin Syringe-Needle U-100 (TRUEPLUS INSULIN SYRINGE) 30G X 5/16" 1 ML MISC Inject into the skin as instructed 100 each 12  . Multiple Vitamins-Minerals (MULTIVITAL) tablet Take 1 tablet by mouth daily.    Marland Kitchen nystatin cream (MYCOSTATIN) Apply 1 application 2 (two) times daily topically. 30 g 0  . PARoxetine (PAXIL) 20 MG tablet Take 1 tablet (20 mg total) by mouth daily. 90 tablet 1  . ramipril (ALTACE) 2.5 MG capsule Take 1 capsule (2.5 mg total) daily by mouth. 90 capsule 3  . ranitidine (ZANTAC) 150 MG tablet TAKE 2 TABLET BY MOUTH AT BEDTIME. 60 tablet 2  .  sitaGLIPtin (JANUVIA) 100 MG tablet Take 1 tablet (100 mg total) daily by mouth. 30 tablet 3  . cholecalciferol (VITAMIN D) 1000 units tablet Take 1,000 Units by mouth daily.    Marland Kitchen ibuprofen (ADVIL,MOTRIN) 600 MG tablet Take 1 tablet (600 mg total) by mouth every 6 (six) hours as needed. (Patient not taking: Reported on 06/02/2017) 30 tablet 0  . omega-3 acid ethyl esters (LOVAZA) 1 g capsule Take 2 capsules (2 g total) by mouth 2 (two) times daily. (Patient not taking: Reported on 06/02/2017) 60 capsule 2  . traZODone (DESYREL) 100 MG tablet Take 1 tablet (100 mg total) at bedtime as needed by mouth for sleep. (Patient not taking: Reported on 06/02/2017) 30 tablet 2  . benzonatate (TESSALON) 100 MG capsule Take 1-2 capsules (100-200 mg total) by mouth 3 (three) times daily as needed for cough. (Patient not taking: Reported on 06/02/2017) 21 capsule 0  . hydrOXYzine (ATARAX/VISTARIL) 10 MG tablet Take 1 tablet (10 mg total) by mouth 3 (three) times daily as needed. (Patient not taking: Reported on  03/10/2017) 30 tablet 5   No facility-administered medications prior to visit.     Allergies  Allergen Reactions  . Fentanyl   . Metformin And Related     Mood swings  . Phenergan [Promethazine Hcl]        Objective:    BP 131/74 (BP Location: Right Arm, Patient Position: Sitting, Cuff Size: Normal)   Pulse 63   Temp 98.1 F (36.7 C) (Oral)   Ht '5\' 2"'$  (1.575 m)   Wt 189 lb 3.2 oz (85.8 kg)   SpO2 97%   BMI 34.61 kg/m  Wt Readings from Last 3 Encounters:  06/02/17 189 lb 3.2 oz (85.8 kg)  05/23/17 189 lb (85.7 kg)  04/13/17 187 lb 12.8 oz (85.2 kg)    Physical Exam  Constitutional: She is oriented to person, place, and time. She appears well-developed and well-nourished. She is cooperative.  HENT:  Head: Normocephalic and atraumatic.  Mouth/Throat: Abnormal dentition. Dental caries present.  She has numerous teeth that have broken off past the gum line. No signs of infection.   Eyes: EOM are normal.  Neck: Normal range of motion.  Cardiovascular: Normal rate, regular rhythm and normal heart sounds. Exam reveals no gallop and no friction rub.  No murmur heard. Pulmonary/Chest: Effort normal and breath sounds normal. No tachypnea. No respiratory distress. She has no decreased breath sounds. She has no wheezes. She has no rhonchi. She has no rales. She exhibits no tenderness.  Abdominal: Soft. Bowel sounds are normal.  Musculoskeletal: Normal range of motion. She exhibits no edema.  Neurological: She is alert and oriented to person, place, and time. She has normal strength. She displays no tremor. No cranial nerve deficit or sensory deficit. She displays no seizure activity. Coordination and gait normal.  Skin: Skin is warm and dry.  Sensory exam of the foot is normal, tested with the monofilament. Good pulses, no lesions or ulcers, good peripheral pulses.  Psychiatric: She has a normal mood and affect. Her behavior is normal. Judgment and thought content normal.  Nursing  note and vitals reviewed.     Patient has been counseled extensively about nutrition and exercise as well as the importance of adherence with medications and regular follow-up. The patient was given clear instructions to go to ER or return to medical center if symptoms don't improve, worsen or new problems develop. The patient verbalized understanding.   Follow-up: Return in about 3  months (around 08/30/2017) for HTN/HPL/DM.   Gildardo Pounds, FNP-BC [facility] [place], [place]   06/04/2017, 10:51 PM

## 2017-06-02 NOTE — Patient Instructions (Addendum)

## 2017-06-03 LAB — LIPID PANEL
CHOL/HDL RATIO: 6 ratio — AB (ref 0.0–4.4)
Cholesterol, Total: 295 mg/dL — ABNORMAL HIGH (ref 100–199)
HDL: 49 mg/dL (ref >39)
Triglycerides: 461 mg/dL — ABNORMAL HIGH (ref 0–149)

## 2017-06-03 LAB — CBC
HEMATOCRIT: 43.8 % (ref 34.0–46.6)
HEMOGLOBIN: 15 g/dL (ref 11.1–15.9)
MCH: 30.1 pg (ref 26.6–33.0)
MCHC: 34.2 g/dL (ref 31.5–35.7)
MCV: 88 fL (ref 79–97)
Platelets: 311 10*3/uL (ref 150–379)
RBC: 4.99 x10E6/uL (ref 3.77–5.28)
RDW: 14.3 % (ref 12.3–15.4)
WBC: 10.9 10*3/uL — AB (ref 3.4–10.8)

## 2017-06-04 ENCOUNTER — Encounter: Payer: Self-pay | Admitting: Nurse Practitioner

## 2017-06-04 MED ORDER — OMEGA-3-ACID ETHYL ESTERS 1 G PO CAPS: 2.0000 g | ORAL_CAPSULE | Freq: Two times a day (BID) | ORAL | 2 refills | Status: AC

## 2017-06-05 ENCOUNTER — Telehealth: Payer: Self-pay

## 2017-06-05 NOTE — Telephone Encounter (Signed)
-----   Message from Claiborne RiggZelda W Fleming, NP sent at 06/04/2017 11:22 PM EST ----- WBC is lower and trending back towards normal. Will continue to monitor. Triglycerides are lower. Continue lovaza (omega 3) and atorvastatin daily. I would like for you to take the atorvastatin before you go to sleep as it seems to be more effective while you are sleeping.

## 2017-06-05 NOTE — Telephone Encounter (Signed)
CMA called patient to inform on lab result and PCP advising.  Patient understood.

## 2017-06-15 MED FILL — PARoxetine HCL 20 MG TABS: 20 | 30 days supply | Qty: 30 | Fill #1

## 2017-06-15 MED FILL — !NOVOLOG MIX 70/30 VIAL: 70-30/ML | 30 days supply | Qty: 30 | Fill #2

## 2017-06-15 MED FILL — RAMIPRIL 2.5 MG CAPS: 2.5 | 30 days supply | Qty: 30 | Fill #2

## 2017-06-23 MED FILL — ATORVASTATIN 40 MG TABLET: 40 | 30 days supply | Qty: 30 | Fill #2

## 2017-06-30 ENCOUNTER — Ambulatory Visit: Payer: No Typology Code available for payment source

## 2017-06-30 ENCOUNTER — Telehealth: Payer: Self-pay

## 2017-06-30 DIAGNOSIS — K219 Gastro-esophageal reflux disease without esophagitis: Secondary | ICD-10-CM

## 2017-06-30 MED FILL — ?ROPINIROLE HCL 2MG TABLET: 2 | 30 days supply | Qty: 30 | Fill #1

## 2017-06-30 NOTE — Telephone Encounter (Signed)
Pharmacy request to refill Ranitidine HCL.  Okay to refill quantity:90 quantity with refill:1?

## 2017-07-03 NOTE — Telephone Encounter (Signed)
Noted. Agree with prescription request

## 2017-07-04 ENCOUNTER — Other Ambulatory Visit: Payer: Self-pay

## 2017-07-04 MED ORDER — RANITIDINE HCL 150 MG PO TABS: ORAL_TABLET | ORAL | 1 refills | Status: AC

## 2017-07-04 MED FILL — raNITIdine HCL 150 MG TABS: 150 | 30 days supply | Qty: 60 | Fill #0

## 2017-07-04 NOTE — Telephone Encounter (Signed)
Rx has been sent  

## 2017-07-05 ENCOUNTER — Telehealth: Payer: Self-pay | Admitting: Nurse Practitioner

## 2017-07-05 NOTE — Telephone Encounter (Signed)
Pt came to the office to request a referral for a dentist, she need surgery for it, please follow up

## 2017-07-06 ENCOUNTER — Other Ambulatory Visit: Payer: Self-pay | Admitting: Nurse Practitioner

## 2017-07-06 DIAGNOSIS — K089 Disorder of teeth and supporting structures, unspecified: Secondary | ICD-10-CM

## 2017-07-06 NOTE — Telephone Encounter (Signed)
Referral placed.

## 2017-08-08 ENCOUNTER — Other Ambulatory Visit: Payer: Self-pay | Admitting: Nurse Practitioner

## 2017-08-08 DIAGNOSIS — E785 Hyperlipidemia, unspecified: Secondary | ICD-10-CM

## 2017-08-08 DIAGNOSIS — G2581 Restless legs syndrome: Secondary | ICD-10-CM

## 2017-08-08 MED FILL — RAMIPRIL 2.5 MG CAPS: 2.5 | 30 days supply | Qty: 30 | Fill #3

## 2017-08-08 MED FILL — ?GLIMEPIRIDE 4 MG TABLET: 4 | 30 days supply | Qty: 60 | Fill #2

## 2017-08-08 MED FILL — PARoxetine HCL 20 MG TABS: 20 | 30 days supply | Qty: 30 | Fill #2

## 2017-08-08 MED FILL — ?ROPINIROLE HCL 2MG TABLET: 2 | 30 days supply | Qty: 30 | Fill #0

## 2017-08-08 MED FILL — ?ATORVASTATIN 40MG TABLET: 40 | 30 days supply | Qty: 30 | Fill #0

## 2017-08-08 MED FILL — OMEGA-3 ETHYL ESTERS 1 GM C: 1 | 15 days supply | Qty: 60 | Fill #1

## 2017-08-30 ENCOUNTER — Ambulatory Visit: Payer: Self-pay | Admitting: Nurse Practitioner

## 2017-09-20 ENCOUNTER — Other Ambulatory Visit: Payer: Self-pay | Admitting: Nurse Practitioner

## 2017-09-20 DIAGNOSIS — G2581 Restless legs syndrome: Secondary | ICD-10-CM

## 2017-09-20 MED FILL — $NOVOLOG MIX 70/30 VIAL: (70-30) 100 | 30 days supply | Qty: 30 | Fill #3

## 2017-09-20 MED FILL — traZODone HCL 100 MG TABS: 100 | 30 days supply | Qty: 30 | Fill #1

## 2017-09-20 MED FILL — RAMIPRIL 2.5 MG CAPS: 2.5 | 30 days supply | Qty: 30 | Fill #4

## 2017-09-20 MED FILL — PARoxetine HCL 20 MG TABS: 20 | 30 days supply | Qty: 30 | Fill #3 | Status: TO

## 2017-09-20 MED FILL — raNITIdine HCL 150 MG TABS: 150 | 30 days supply | Qty: 60 | Fill #1 | Status: TO

## 2017-09-20 MED FILL — ?ATORVASTATIN 40MG TABLET: 40 | 30 days supply | Qty: 30 | Fill #1

## 2017-09-21 MED FILL — rOPINIRole HCL 2 MG TABS: 2 | 30 days supply | Qty: 30 | Fill #0

## 2017-10-20 ENCOUNTER — Ambulatory Visit: Payer: Self-pay | Admitting: Nurse Practitioner

## 2018-04-13 IMAGING — MG DIGITAL SCREENING BILATERAL MAMMOGRAM WITH CAD
3 series · 3 of 3 positions shown · non-contrast
Comparison: Previous exam(s).

CLINICAL DATA: Screening.

EXAM:
DIGITAL SCREENING BILATERAL MAMMOGRAM WITH CAD

[L CC]
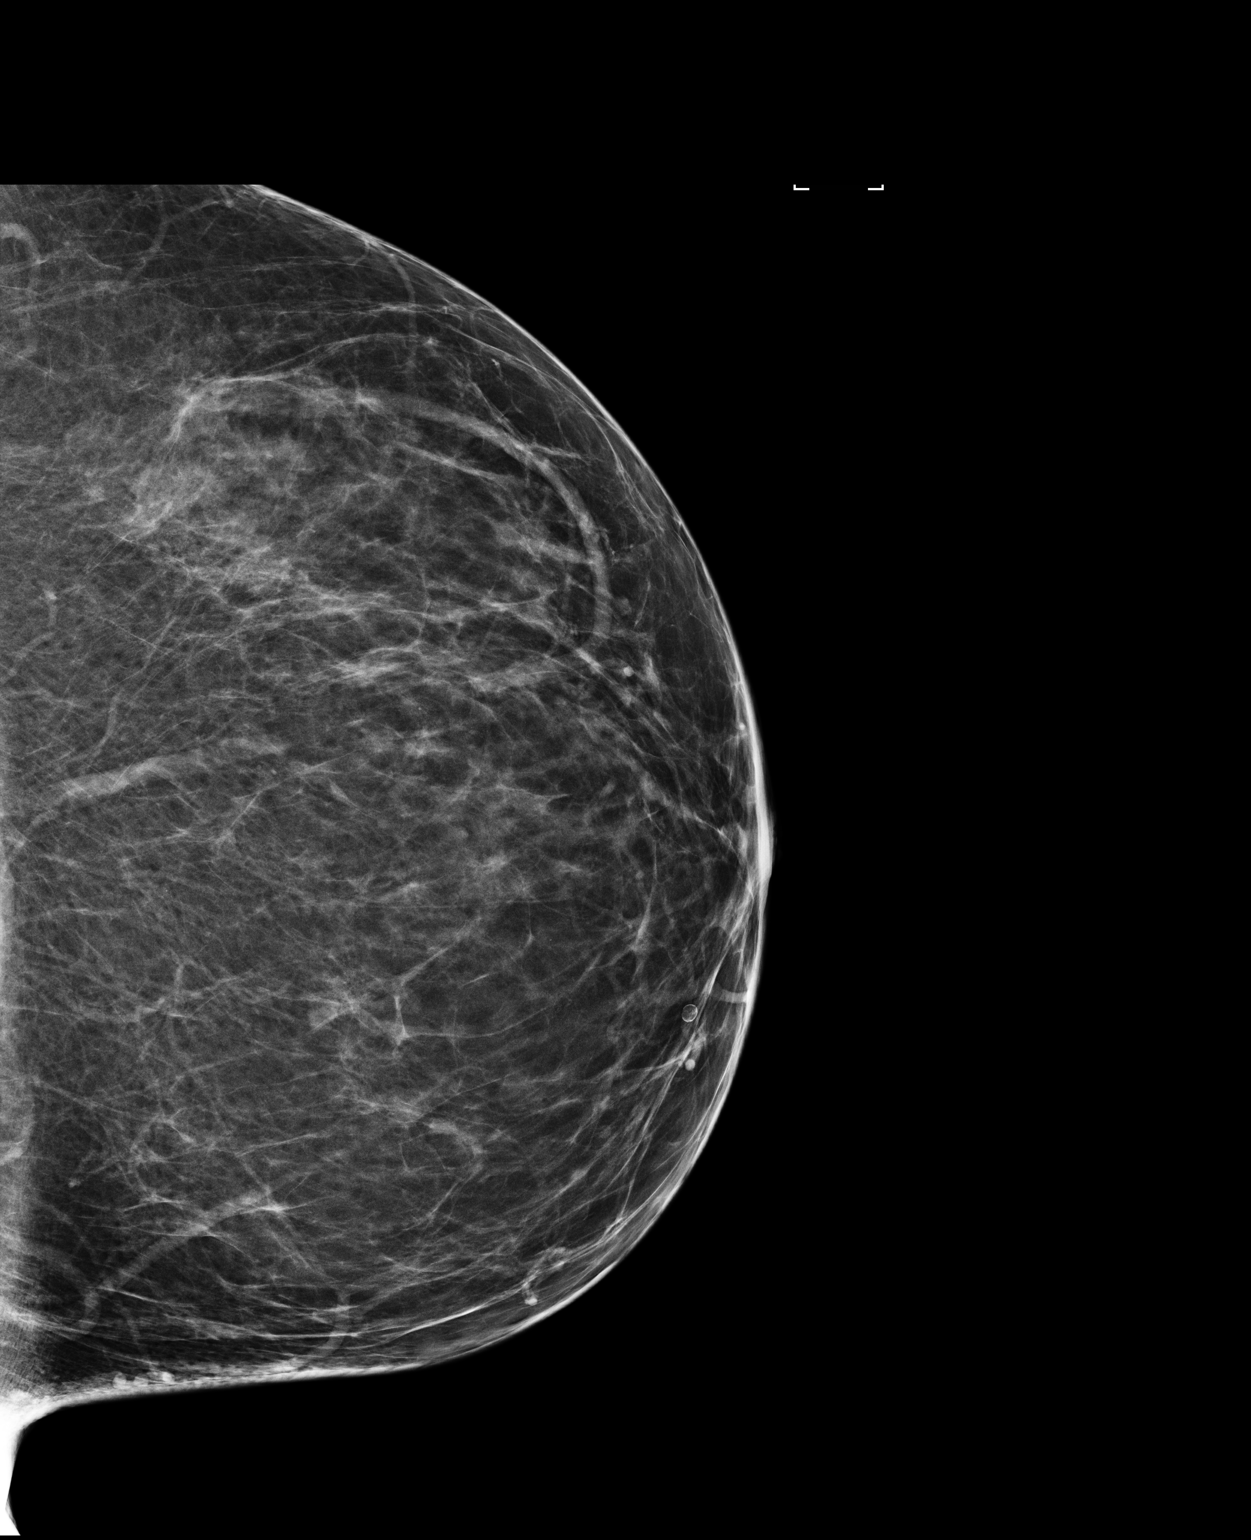

[L MLO]
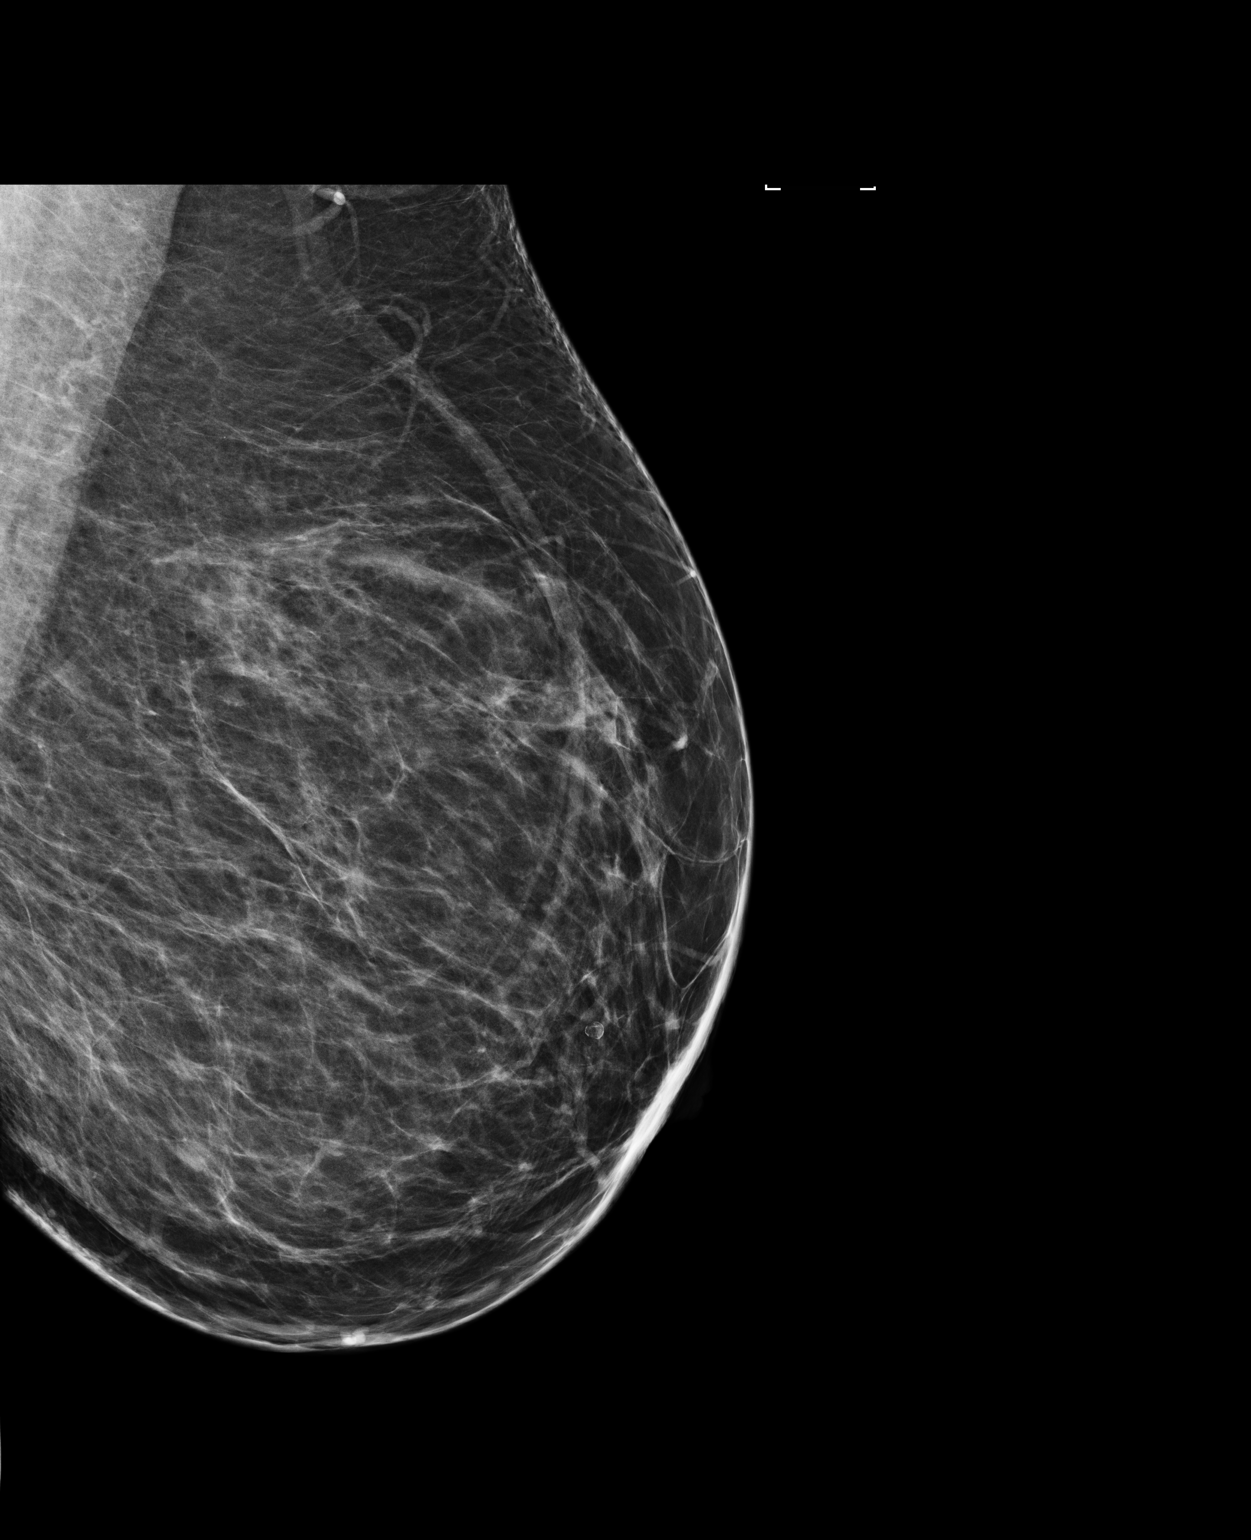

[R MLO]
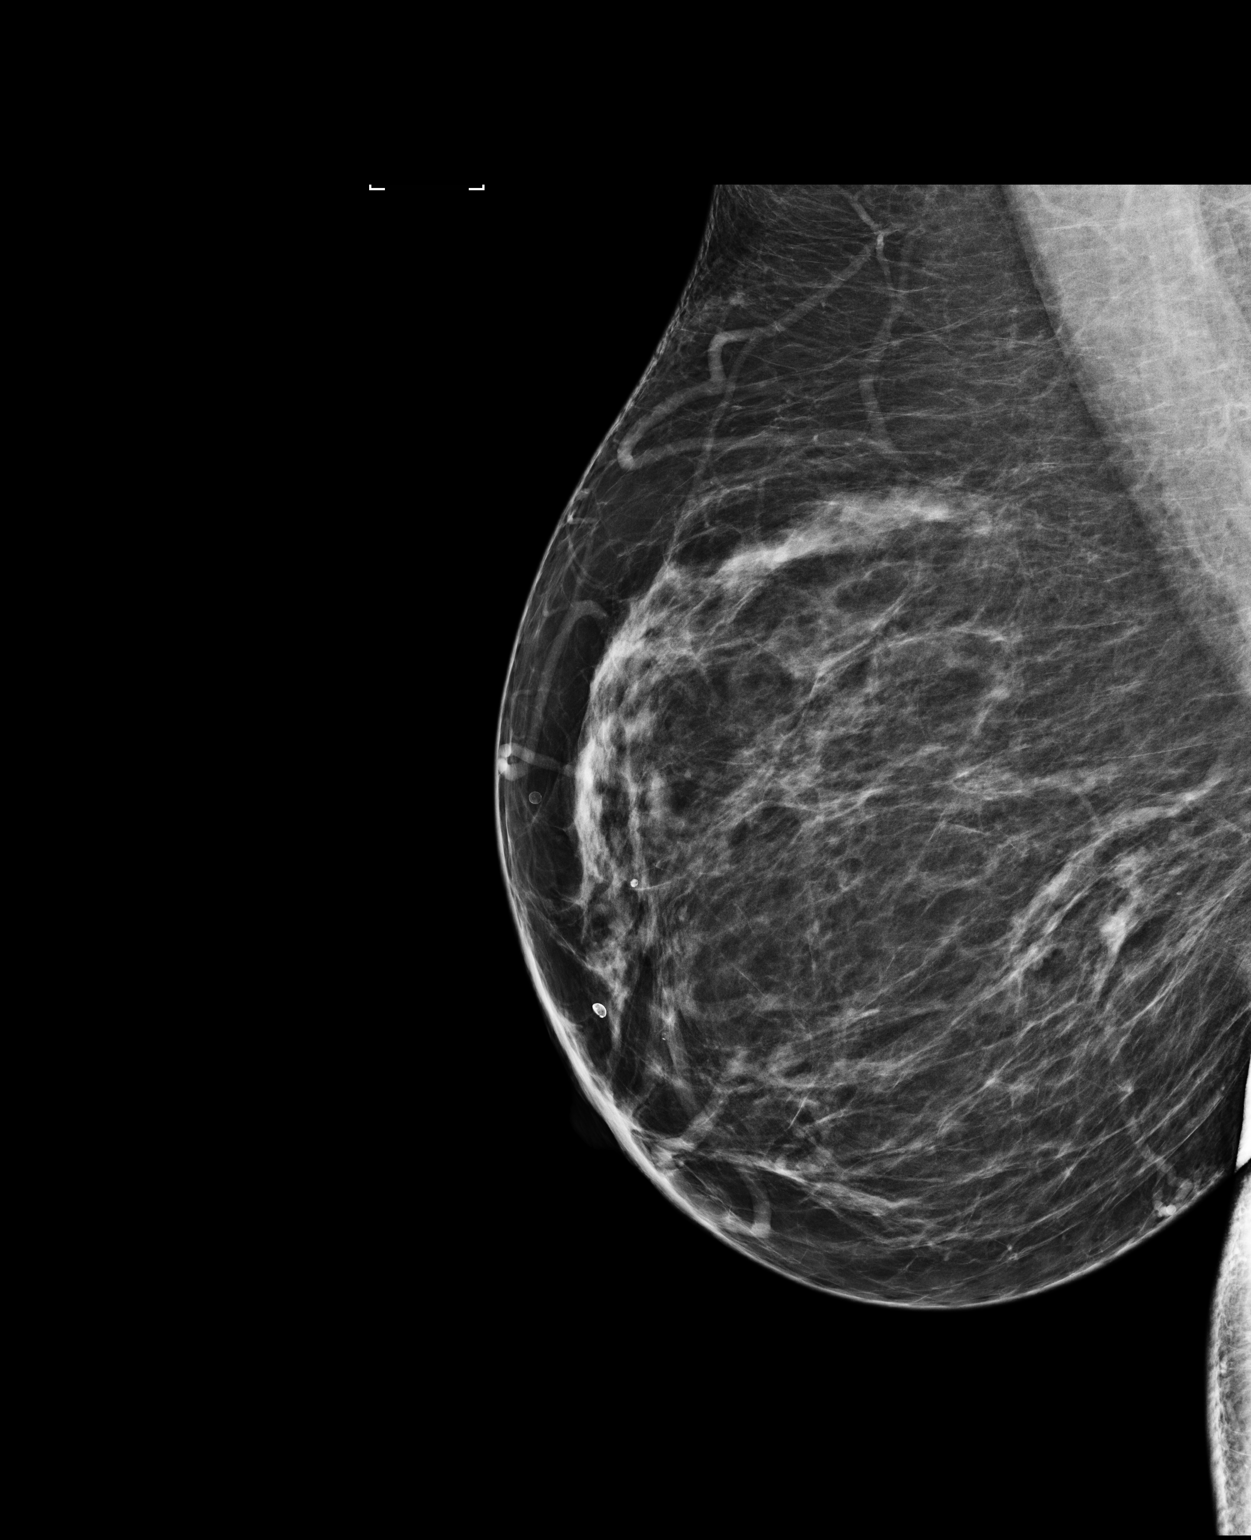

[3 of 3 positions shown; findings below may reference images not displayed]

ACR Breast Density Category b: There are scattered areas of
fibroglandular density.
FINDINGS: There are no findings suspicious for malignancy. Images were
processed with CAD.
IMPRESSION: No mammographic evidence of malignancy. A result letter of this
screening mammogram will be mailed directly to the patient.

RECOMMENDATION:
Screening mammogram in one year. (Code:AS-G-LCT)

BI-RADS CATEGORY  1: Negative.
